# Patient Record
Sex: Female | Born: 1980 | Race: White | Hispanic: No | Marital: Married | Smoking: Former smoker
Health system: Southern US, Community
[De-identification: ages and names within clinical notes are randomized; demographics above are authoritative.]

## PROBLEM LIST (undated history)

## (undated) ENCOUNTER — Inpatient Hospital Stay (HOSPITAL_COMMUNITY): Payer: Self-pay

## (undated) DIAGNOSIS — R51 Headache: Secondary | ICD-10-CM

## (undated) DIAGNOSIS — K219 Gastro-esophageal reflux disease without esophagitis: Secondary | ICD-10-CM

## (undated) DIAGNOSIS — K589 Irritable bowel syndrome without diarrhea: Secondary | ICD-10-CM

## (undated) DIAGNOSIS — E282 Polycystic ovarian syndrome: Secondary | ICD-10-CM

## (undated) DIAGNOSIS — Z8619 Personal history of other infectious and parasitic diseases: Secondary | ICD-10-CM

## (undated) DIAGNOSIS — Z8489 Family history of other specified conditions: Secondary | ICD-10-CM

## (undated) DIAGNOSIS — F32A Depression, unspecified: Secondary | ICD-10-CM

## (undated) DIAGNOSIS — O24419 Gestational diabetes mellitus in pregnancy, unspecified control: Secondary | ICD-10-CM

## (undated) DIAGNOSIS — Z87442 Personal history of urinary calculi: Secondary | ICD-10-CM

## (undated) DIAGNOSIS — O139 Gestational [pregnancy-induced] hypertension without significant proteinuria, unspecified trimester: Secondary | ICD-10-CM

## (undated) DIAGNOSIS — J45909 Unspecified asthma, uncomplicated: Secondary | ICD-10-CM

## (undated) DIAGNOSIS — F419 Anxiety disorder, unspecified: Secondary | ICD-10-CM

## (undated) DIAGNOSIS — F329 Major depressive disorder, single episode, unspecified: Secondary | ICD-10-CM

## (undated) DIAGNOSIS — G4730 Sleep apnea, unspecified: Secondary | ICD-10-CM

## (undated) HISTORY — DX: Personal history of other infectious and parasitic diseases: Z86.19

## (undated) HISTORY — DX: Depression, unspecified: F32.A

## (undated) HISTORY — PX: WISDOM TOOTH EXTRACTION: SHX21

## (undated) HISTORY — DX: Major depressive disorder, single episode, unspecified: F32.9

## (undated) HISTORY — DX: Irritable bowel syndrome without diarrhea: K58.9

## (undated) HISTORY — PX: LAPAROSCOPIC OVARIAN CYSTECTOMY: SUR786

---
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
## 2012-03-12 LAB — OB RESULTS CONSOLE GC/CHLAMYDIA
Chlamydia: NEGATIVE
Gonorrhea: NEGATIVE

## 2012-03-12 LAB — OB RESULTS CONSOLE HIV ANTIBODY (ROUTINE TESTING): HIV: NONREACTIVE

## 2012-03-12 LAB — OB RESULTS CONSOLE RUBELLA ANTIBODY, IGM: Rubella: IMMUNE

## 2012-05-03 ENCOUNTER — Inpatient Hospital Stay (HOSPITAL_COMMUNITY)
Admission: AD | Admit: 2012-05-03 | Discharge: 2012-05-03 | Disposition: A | Discharge status: Home / Self Care | Payer: BC Managed Care – PPO | Source: Ambulatory Visit | Attending: Obstetrics and Gynecology | Admitting: Obstetrics and Gynecology

## 2012-05-03 ENCOUNTER — Encounter (HOSPITAL_COMMUNITY): Payer: Self-pay | Admitting: *Deleted

## 2012-05-03 DIAGNOSIS — R51 Headache: Secondary | ICD-10-CM | POA: Insufficient documentation

## 2012-05-03 DIAGNOSIS — O99891 Other specified diseases and conditions complicating pregnancy: Secondary | ICD-10-CM | POA: Insufficient documentation

## 2012-05-03 DIAGNOSIS — O26899 Other specified pregnancy related conditions, unspecified trimester: Secondary | ICD-10-CM

## 2012-05-03 HISTORY — DX: Anxiety disorder, unspecified: F41.9

## 2012-05-03 HISTORY — DX: Gestational (pregnancy-induced) hypertension without significant proteinuria, unspecified trimester: O13.9

## 2012-05-03 HISTORY — DX: Headache: R51

## 2012-05-03 HISTORY — DX: Gestational diabetes mellitus in pregnancy, unspecified control: O24.419

## 2012-05-03 LAB — URINALYSIS, ROUTINE W REFLEX MICROSCOPIC
Glucose, UA: NEGATIVE mg/dL
Hgb urine dipstick: NEGATIVE
Specific Gravity, Urine: 1.02 (ref 1.005–1.030)

## 2012-05-03 LAB — URINE MICROSCOPIC-ADD ON

## 2012-05-03 MED ORDER — OXYCODONE-ACETAMINOPHEN 5-325 MG PO TABS
1.0000 | ORAL_TABLET | Freq: Once | ORAL | Status: AC
  Administered 2012-05-03: 1 via ORAL
  Filled 2012-05-03: qty 1

## 2012-05-03 NOTE — Discharge Instructions (Signed)
If you have a neurologist who follows you for your migraines you should schedule a follow up appointment. If you do not have one call the office and they will arrange an appointment for you.  General Headache, Without Cause A general headache has no specific cause. These headaches are not life-threatening. They will not lead to other types of headaches. HOME CARE   Make and keep follow-up visits with your doctor.   Only take medicine as told by your doctor.   Try to relax, get a massage, or use your thoughts to control your body (biofeedback).   Apply cold or heat to the head and neck. Apply 3 or 4 times a day or as needed.  Finding out the results of your test Ask when your test results will be ready. Make sure you get your test results. GET HELP RIGHT AWAY IF:   You have problems with medicine.   Your medicine does not help relieve pain.   Your headache changes or becomes worse.   You feel sick to your stomach (nauseous) or throw up (vomit).   You have a temperature by mouth above 102 F (38.9 C), not controlled by medicine.   Your have a stiff neck.   You have vision loss.   You have muscle weakness.   You lose control of your muscles.   You lose balance or have trouble walking.   You feel like you are going to pass out (faint).  MAKE SURE YOU:   Understand these instructions.   Will watch this condition.   Will get help right away if you are not doing well or get worse.  Document Released: 08/15/2008 Document Revised: 10/26/2011 Document Reviewed: 08/15/2008 [facility] Patient Information 2012 [place], [place].

## 2012-05-03 NOTE — MAU Provider Note (Signed)
History     CSN: [id number]  Arrival date & time 05/03/12  1053   None     HPI Natalie Andrews is a 31 y.o. female @ [redacted]w[redacted]d gestation who presents to MAU for headache. History of migraines and this headache feels similar. The headache started 24 hours ago. The headache is located bilateral temporal area. She rates the pain as 7/10. She describes the pain as feeling like a squeezing pain on both sides of her head. Denies nausea or vomiting or visual disturbances. The history was provided by the patient.   Past Medical History  Diagnosis Date  . Anxiety   . Pregnancy induced hypertension   . Diabetes mellitus   . Headache     migraines  . Gestational diabetes     Past Surgical History  Procedure Date  . Pilonidal cyst excision 2002  . Tonsillectomy 1989  . Fracture surgery 1999    ankle    Family History  Problem Relation Age of Onset  . Arthritis Mother   . Depression Mother   . Miscarriages / [place] Mother   . Diabetes Father   . Early death Father   . Kidney disease Father   . Vision loss Father   . Arthritis Son     History  Substance Use Topics  . Smoking status: Former Smoker    Quit date: 05/04/2007  . Smokeless tobacco: Not on file  . Alcohol Use: No    OB History    Grav Para Term Preterm Abortions TAB SAB Ect Mult Living   4 1 1  0 2 0 2 0 0 1      Review of Systems  Constitutional: Negative for fever, chills, diaphoresis and fatigue.  HENT: Negative for ear pain, congestion, sore throat, facial swelling, neck pain, neck stiffness, dental problem and sinus pressure.   Eyes: Negative for photophobia, pain, discharge and visual disturbance.  Respiratory: Negative for cough, chest tightness and wheezing.   Cardiovascular: Negative for chest pain and palpitations.  Gastrointestinal: Negative for nausea, vomiting, abdominal pain, diarrhea, constipation and abdominal distention.  Genitourinary: Positive for frequency. Negative for dysuria, flank pain,  vaginal bleeding, vaginal discharge, difficulty urinating and pelvic pain.  Musculoskeletal: Negative for myalgias, back pain and gait problem.  Skin: Negative for color change and rash.  Neurological: Positive for headaches. Negative for dizziness, speech difficulty, weakness, light-headedness and numbness.  Psychiatric/Behavioral: Negative for confusion and agitation. The patient is not nervous/anxious.     Allergies  Prednisone  Home Medications  No current outpatient prescriptions on file.  BP 133/72  Pulse 87  Temp 97 F (36.1 C) (Oral)  Resp 20  Ht 5\' 4"  (1.626 m)  Wt 304 lb (137.893 kg)  BMI 52.18 kg/m2  LMP 01/15/2012  Physical Exam  Nursing note and vitals reviewed. Constitutional: She is oriented to person, place, and time. She appears well-developed and well-nourished.  HENT:  Head: Normocephalic.  Eyes: EOM are normal.  Neck: Neck supple.  Cardiovascular: Normal rate.   Pulmonary/Chest: Effort normal.  Abdominal: Soft. There is no tenderness.       Positive FHT.  Musculoskeletal: Normal range of motion. She exhibits no edema.       Normal pulses bilateral, upper and lower extremities  Neurological: She is alert and oriented to person, place, and time. She has normal strength and normal reflexes. No cranial nerve deficit or sensory deficit. She displays a negative Romberg sign. Coordination normal.  Skin: Skin is warm and dry.  Psychiatric:  She has a normal mood and affect. Her behavior is normal. Judgment and thought content normal.   Results for orders placed during the hospital encounter of 05/03/12 (from the past 24 hour(s))  URINALYSIS, ROUTINE W REFLEX MICROSCOPIC     Status: Abnormal   Collection Time   05/03/12 12:38 PM      Component Value Range   Color, Urine YELLOW  YELLOW   APPearance CLOUDY (*) CLEAR   Specific Gravity, Urine 1.020  1.005 - 1.030   pH 7.0  5.0 - 8.0   Glucose, UA NEGATIVE  NEGATIVE mg/dL   Hgb urine dipstick NEGATIVE  NEGATIVE     Bilirubin Urine NEGATIVE  NEGATIVE   Ketones, ur NEGATIVE  NEGATIVE mg/dL   Protein, ur NEGATIVE  NEGATIVE mg/dL   Urobilinogen, UA 0.2  0.0 - 1.0 mg/dL   Nitrite NEGATIVE  NEGATIVE   Leukocytes, UA MODERATE (*) NEGATIVE  URINE MICROSCOPIC-ADD ON     Status: Abnormal   Collection Time   05/03/12 12:38 PM      Component Value Range   Squamous Epithelial / LPF MANY (*) RARE   WBC, UA 3-6  <3 WBC/hpf   RBC / HPF 0-2  <3 RBC/hpf   Bacteria, UA MANY (*) RARE    ED Course  Procedures Re evaluation: After percocet the patient states her headache is much better. I discussed her findings with Dr. Marcelle Overlie and the patient is to follow up with her neurologist for her headaches. She will return to the office as scheduled.  MDM  Assessment: Headache in pregnancy  Plan:  Patient has Vicodin at home    Follow up with neurologist   Return as needed.

## 2012-05-04 LAB — URINE CULTURE

## 2012-05-18 ENCOUNTER — Encounter (HOSPITAL_COMMUNITY): Payer: Self-pay | Admitting: *Deleted

## 2012-05-18 ENCOUNTER — Inpatient Hospital Stay (HOSPITAL_COMMUNITY): Payer: BC Managed Care – PPO

## 2012-05-18 ENCOUNTER — Inpatient Hospital Stay (HOSPITAL_COMMUNITY)
Admission: AD | Admit: 2012-05-18 | Discharge: 2012-05-18 | Disposition: A | Discharge status: Home / Self Care | Payer: BC Managed Care – PPO | Source: Ambulatory Visit | Attending: Obstetrics and Gynecology | Admitting: Obstetrics and Gynecology

## 2012-05-18 DIAGNOSIS — O99891 Other specified diseases and conditions complicating pregnancy: Secondary | ICD-10-CM | POA: Insufficient documentation

## 2012-05-18 DIAGNOSIS — R109 Unspecified abdominal pain: Secondary | ICD-10-CM | POA: Insufficient documentation

## 2012-05-18 DIAGNOSIS — R823 Hemoglobinuria: Secondary | ICD-10-CM

## 2012-05-18 DIAGNOSIS — Z331 Pregnant state, incidental: Secondary | ICD-10-CM

## 2012-05-18 DIAGNOSIS — O210 Mild hyperemesis gravidarum: Secondary | ICD-10-CM | POA: Insufficient documentation

## 2012-05-18 LAB — URINALYSIS, ROUTINE W REFLEX MICROSCOPIC
Glucose, UA: NEGATIVE mg/dL
Protein, ur: NEGATIVE mg/dL
Specific Gravity, Urine: 1.025 (ref 1.005–1.030)
Urobilinogen, UA: 0.2 mg/dL (ref 0.0–1.0)

## 2012-05-18 LAB — URINE MICROSCOPIC-ADD ON

## 2012-05-18 MED ORDER — HYDROCODONE-ACETAMINOPHEN 5-325 MG PO TABS
1.0000 | ORAL_TABLET | Freq: Once | ORAL | Status: AC
  Administered 2012-05-18: 1 via ORAL
  Filled 2012-05-18: qty 1

## 2012-05-18 NOTE — Discharge Instructions (Signed)
Drink at least 8 8-oz glasses of water every day.  Take Tylenol 325 mg 2 tablets by mouth every 4 hours if needed for pain.  Call your doctor if your symptoms worsen.

## 2012-05-18 NOTE — MAU Provider Note (Signed)
History     CSN: [id number]  Arrival date and time: 05/18/12 1419   First Provider Initiated Contact with Patient 05/18/12 1527      Chief Complaint  Patient presents with  . Flank Pain  . Abdominal Pain  . Nausea   HPI Natalie Andrews 31 y.o. [redacted]w[redacted]d Comes to MAU with sudden acute onset of left flank pain which started within one hour of arriving in MAU.  Pain begins in left flank and radiates to left abdomen.  Having severe nausea and vomited on arriving in MAU.  Pain was less after episode of vomiting.  Has not taken any medication for the pain.  OB History    Grav Para Term Preterm Abortions TAB SAB Ect Mult Living   4 1 1  0 2 0 2 0 0 1      Past Medical History  Diagnosis Date  . Anxiety   . Pregnancy induced hypertension   . Diabetes mellitus   . Headache     migraines  . Gestational diabetes     Past Surgical History  Procedure Date  . Pilonidal cyst excision 2002  . Tonsillectomy 1989  . Fracture surgery 1999    ankle    Family History  Problem Relation Age of Onset  . Arthritis Mother   . Depression Mother   . Miscarriages / [place] Mother   . Diabetes Father   . Early death Father   . Kidney disease Father   . Vision loss Father   . Arthritis Son     History  Substance Use Topics  . Smoking status: Former Smoker    Quit date: 05/04/2007  . Smokeless tobacco: Not on file  . Alcohol Use: No    Allergies:  Allergies  Allergen Reactions  . Prednisone Hives    Prescriptions prior to admission  Medication Sig Dispense Refill  . Hydrocodone-Acetaminophen (VICODIN) 5-300 MG TABS Take 1 tablet by mouth daily as needed. For migraines.      . Prenatal Vit-Fe Fumarate-FA (PRENATAL MULTIVITAMIN) TABS Take 1 tablet by mouth at bedtime.       Marland Kitchen venlafaxine XR (EFFEXOR-XR) 150 MG 24 hr capsule Take 150 mg by mouth at bedtime.         Review of Systems  Gastrointestinal: Positive for nausea and vomiting.       Left back pain radiating to left  abdomen  Genitourinary: Positive for flank pain. Negative for dysuria, urgency and hematuria.   Physical Exam   Blood pressure 130/70, pulse 78, temperature 97.8 F (36.6 C), temperature source Oral, resp. rate 22, height 5\' 4"  (1.626 m), weight 313 lb 6.4 oz (142.157 kg), last menstrual period 01/15/2012, SpO2 98.00%.  Physical Exam  Nursing note and vitals reviewed. Constitutional: She is oriented to person, place, and time. She appears well-developed and well-nourished.       obese  HENT:  Head: Normocephalic.  Eyes: EOM are normal.  Neck: Neck supple.  GI: Soft. There is no tenderness.       FHT heard with doppler. - 160.  Genitourinary:       Soreness in left flank, but no CVA tenderness  Musculoskeletal: Normal range of motion.  Neurological: She is alert and oriented to person, place, and time.  Skin: Skin is warm and dry.  Psychiatric: She has a normal mood and affect.    MAU Course  Procedures *RADIOLOGY REPORT*  Clinical Data: Acute onset of left-sided flank pain.  Hemoglobinuria the.  RENAL/URINARY TRACT ULTRASOUND  COMPLETE  Comparison: No priors.  Findings:  Right Kidney: No hydronephrosis. Well-preserved cortex. Normal  size and parenchymal echogenicity, with the exception of a small  echogenic focus in the upper pole collecting system of the right  kidney, without definite distal acoustic shadowing. 13.4 cm in  length.  Left Kidney: No hydronephrosis. Well-preserved cortex. Normal  size and parenchymal echotexture without focal abnormalities. 13.3  cm in length  Bladder: Urinary bladder is well distended without focal wall  abnormalities. Bilateral ureteral jets are noted.  IMPRESSION:  1. Small echogenic focus in the upper pole collecting system of  the right kidney. This is of uncertain etiology and significance.  This could represent a nonshadowing stone, but could alternatively  be a normal part of the collecting system. If there is strong  clinical  concern for nephrolithiasis, this could be better  evaluated with a noncontrast CT scan of the abdomen.  2. Normal appearance of the left kidney and urinary bladder.   MDM [id number]  Consult with Dr. Arelia Sneddon re: plan of care 1712  Client feeling better, but still has dull pain in left flank.  Requesting pain medication.  Will give one vicodin tablet and discharge.  Discussed results of renal ultrasound.  Urine culture pending.  Assessment and Plan  Left flank pain Pregnant [redacted] weeks Hemoglobinuria   Plan Drink at least 8 8-oz glasses of water every day. Take Tylenol 325 mg 2 tablets by mouth every 4 hours if needed for pain. Call your doctor if your symptoms worsen.  [name] 05/18/2012, 3:38 PM

## 2012-05-18 NOTE — MAU Note (Signed)
Patient states she had sudden onset of left flank pain radiating to the left lower abdomen and nausea about 30 minutes ago. Denies any bleeding.

## 2012-05-19 ENCOUNTER — Encounter (HOSPITAL_COMMUNITY): Payer: Self-pay | Admitting: *Deleted

## 2012-05-19 ENCOUNTER — Inpatient Hospital Stay (HOSPITAL_COMMUNITY)
Admission: AD | Admit: 2012-05-19 | Discharge: 2012-05-19 | Disposition: A | Discharge status: Home / Self Care | Payer: BC Managed Care – PPO | Source: Ambulatory Visit | Attending: Obstetrics and Gynecology | Admitting: Obstetrics and Gynecology

## 2012-05-19 DIAGNOSIS — N289 Disorder of kidney and ureter, unspecified: Secondary | ICD-10-CM

## 2012-05-19 DIAGNOSIS — O99891 Other specified diseases and conditions complicating pregnancy: Secondary | ICD-10-CM | POA: Insufficient documentation

## 2012-05-19 DIAGNOSIS — O210 Mild hyperemesis gravidarum: Secondary | ICD-10-CM | POA: Insufficient documentation

## 2012-05-19 DIAGNOSIS — N200 Calculus of kidney: Secondary | ICD-10-CM

## 2012-05-19 DIAGNOSIS — R109 Unspecified abdominal pain: Secondary | ICD-10-CM | POA: Insufficient documentation

## 2012-05-19 DIAGNOSIS — M549 Dorsalgia, unspecified: Secondary | ICD-10-CM | POA: Insufficient documentation

## 2012-05-19 DIAGNOSIS — O26839 Pregnancy related renal disease, unspecified trimester: Secondary | ICD-10-CM

## 2012-05-19 LAB — URINALYSIS, ROUTINE W REFLEX MICROSCOPIC
Bilirubin Urine: NEGATIVE
Glucose, UA: NEGATIVE mg/dL
Ketones, ur: NEGATIVE mg/dL
Nitrite: NEGATIVE
Protein, ur: NEGATIVE mg/dL
Specific Gravity, Urine: 1.015 (ref 1.005–1.030)
Urobilinogen, UA: 0.2 mg/dL (ref 0.0–1.0)
pH: 7 (ref 5.0–8.0)

## 2012-05-19 LAB — URINE CULTURE: Colony Count: 15000

## 2012-05-19 MED ORDER — LACTATED RINGERS IV SOLN
INTRAVENOUS | Status: DC
  Administered 2012-05-19 (×2): via INTRAVENOUS

## 2012-05-19 MED ORDER — HYDROMORPHONE HCL PF 1 MG/ML IJ SOLN
2.0000 mg | Freq: Once | INTRAMUSCULAR | Status: AC
  Administered 2012-05-19: 1 mg via INTRAVENOUS
  Filled 2012-05-19: qty 1

## 2012-05-19 MED ORDER — HYDROMORPHONE HCL 2 MG PO TABS: 2.0000 mg | ORAL_TABLET | Freq: Four times a day (QID) | ORAL | Status: DC | PRN

## 2012-05-19 NOTE — MAU Note (Signed)
C/o burning with urination; stated that there was blood in the urine;

## 2012-05-19 NOTE — MAU Note (Signed)
Pt reports she was here yesterday with back pain thought it may be a kidney stone. Pain is worse today and she reports having a heavy mucusy discharge that  Larey Seat out this morning

## 2012-05-19 NOTE — MAU Provider Note (Signed)
  History     CSN: [id number]  Arrival date and time: 05/19/12 1130   First Provider Initiated Contact with Patient 05/19/12 1222      Chief Complaint  Patient presents with  . Back Pain   HPI Natalie Andrews is a 31 y.o. [id number] at [redacted]w[redacted]d . She returns with increased  L flank pain. Had MAU visit yesterday for back pain, N&V. U/S showed ? None shadowing stone on R, otherwise nl renal scan. She had 21-50 RBC's in urine with CA oxalate crystals. She started having pain at 10 am, took 1 vicodin, not helping at all. Has gross hematuria now, no N&V but yelling out with discomfort every 2-69minutes.  She alos noticed large mucodi vaginal discharge this am, yellow /green in color. No vaginal bleeding or cramping.    Past Medical History  Diagnosis Date  . Anxiety   . Pregnancy induced hypertension   . Diabetes mellitus   . Headache     migraines  . Gestational diabetes     Past Surgical History  Procedure Date  . Pilonidal cyst excision 2002  . Tonsillectomy 1989  . Fracture surgery 1999    ankle    Family History  Problem Relation Age of Onset  . Arthritis Mother   . Depression Mother   . Miscarriages / [place] Mother   . Diabetes Father   . Early death Father   . Kidney disease Father   . Vision loss Father   . Arthritis Son     History  Substance Use Topics  . Smoking status: Former Smoker    Quit date: 05/04/2007  . Smokeless tobacco: Not on file  . Alcohol Use: No    Allergies:  Allergies  Allergen Reactions  . Prednisone Hives    Prescriptions prior to admission  Medication Sig Dispense Refill  . Hydrocodone-Acetaminophen (VICODIN) 5-300 MG TABS Take 1 tablet by mouth daily as needed. For migraines.      . Prenatal Vit-Fe Fumarate-FA (PRENATAL MULTIVITAMIN) TABS Take 1 tablet by mouth at bedtime.       Marland Kitchen venlafaxine XR (EFFEXOR-XR) 150 MG 24 hr capsule Take 150 mg by mouth at bedtime.         ROS Physical Exam   Blood pressure 134/72, pulse 88,  temperature 98.4 F (36.9 C), temperature source Oral, resp. rate 18, height 5\' 4"  (1.626 m), weight 311 lb 6.4 oz (141.25 kg), last menstrual period 01/15/2012.  Physical Exam  MAU Course  Procedures  MDM Consulted with Dr Arelia Sneddon at 12:43 15:45 Pt feeling better after 2 liters of fluids and dilaudid 2 mg IV. Has strained urine, so stones. Talked with Dr Erlinda Hong Comb, may e discharged, increase fluids, Rx dilaudid 2 mg #30 1 Q 6 hr prn pain, f/u in office this week.  Assessment and Plan    Natalie Andrews M. 05/19/2012, 12:34 PM

## 2012-05-19 NOTE — Discharge Instructions (Signed)
Oxalate Restricted Diet A low-oxalate diet consists of foods that are low in a naturally occurring compound called oxalate that is found in plants.  INDICATIONS FOR USE Your caregiver may ask you to follow a low-oxalate diet in order to reduce certain types of kidney stones.  GUIDELINES  Avoid high-oxalate foods listed below:  Grains: High-fiber or bran cereal, whole-wheat bread, grits, barley, buckwheat, graham crackers, amaranth, pretzels, and fruitcake.   Vegetables: Dried beans, wax beans, French fries, sweet potatoes, chives, dark leafy greens, eggplant, leaks, okra, parsley, rutabaga, tomato paste, watercress, and escarole.   Fruit: Dried apricots, red currants, figs, kiwi, plums, and rhubarb.   Meat and Meat Substitutes: All nuts and nut butters, sesame seeds, and tahini paste. Soybeans and foods made from soy (soyburger, miso).   Milk: Chocolate milk and soymilk.   Fats and Oils: None to avoid.   Condiments/Miscellaneous: Chocolate, carob, marmalade, poppy seeds, instant iced tea, and juice from high-oxalate fruits.  Document Released: 03/03/2011 Document Revised: 10/26/2011 Document Reviewed: 03/03/2011 [employer] Patient Information 2012 [employer].Kidney Stones Kidney stones (ureteral lithiasis) are deposits that form inside your kidneys. The intense pain is caused by the stone moving through the urinary tract. When the stone moves, the ureter goes into spasm around the stone. The stone is usually passed in the urine.  CAUSES  A disorder that makes certain neck glands produce too much parathyroid hormone (primary hyperparathyroidism).  A buildup of uric acid crystals.  Narrowing (stricture) of the ureter.  A kidney obstruction present at birth (congenital obstruction).  Previous surgery on the kidney or ureters.  Numerous kidney infections.  SYMPTOMS  Feeling sick to your stomach (nauseous).  Throwing up (vomiting).  Blood in the urine (hematuria).  Pain that usually  spreads (radiates) to the groin.  Frequency or urgency of urination.  DIAGNOSIS  Taking a history and physical exam.  Blood or urine tests.  Computerized X-ray scan (CT scan).  Occasionally, an examination of the inside of the urinary bladder (cystoscopy) is performed.  TREATMENT  Observation.  Increasing your fluid intake.  Surgery may be needed if you have severe pain or persistent obstruction.  The size, location, and chemical composition are all important variables that will determine the proper choice of action for you. Talk to your caregiver to better understand your situation so that you will minimize the risk of injury to yourself and your kidney.  HOME CARE INSTRUCTIONS  Drink enough water and fluids to keep your urine clear or pale yellow.  Strain all urine through the provided strainer. Keep all particulate matter and stones for your caregiver to see. The stone causing the pain may be as small as a grain of salt. It is very important to use the strainer each and every time you pass your urine. The collection of your stone will allow your caregiver to analyze it and verify that a stone has actually passed.  Only take over-the-counter or prescription medicines for pain, discomfort, or fever as directed by your caregiver.  Make a follow-up appointment with your caregiver as directed.  Get follow-up X-rays if required. The absence of pain does not always mean that the stone has passed. It may have only stopped moving. If the urine remains completely obstructed, it can cause loss of kidney function or even complete destruction of the kidney. It is your responsibility to make sure X-rays and follow-ups are completed. Ultrasounds of the kidney can show blockages and the status of the kidney. Ultrasounds are not associated with   any radiation and can be performed easily in a matter of minutes.  SEEK IMMEDIATE MEDICAL CARE IF:  Pain cannot be controlled with the prescribed medicine.  You have a  fever.  The severity or intensity of pain increases over 18 hours and is not relieved by pain medicine.  You develop a new onset of abdominal pain.  You feel faint or pass out.  MAKE SURE YOU:  Understand these instructions.  Will watch your condition.  Will get help right away if you are not doing well or get worse.  Document Released: 11/06/2005 Document Revised: 10/26/2011 Document Reviewed: 03/04/2010 [employer] Patient Information 2012 [employer]. 

## 2012-05-21 ENCOUNTER — Inpatient Hospital Stay (HOSPITAL_COMMUNITY)
Admission: AD | Admit: 2012-05-21 | Discharge: 2012-05-23 | DRG: 886 | Disposition: A | Discharge status: Home / Self Care | Payer: BC Managed Care – PPO | Source: Ambulatory Visit | Attending: Obstetrics and Gynecology | Admitting: Obstetrics and Gynecology

## 2012-05-21 ENCOUNTER — Inpatient Hospital Stay (HOSPITAL_COMMUNITY): Payer: BC Managed Care – PPO

## 2012-05-21 ENCOUNTER — Encounter (HOSPITAL_COMMUNITY): Payer: Self-pay | Admitting: *Deleted

## 2012-05-21 DIAGNOSIS — Z3490 Encounter for supervision of normal pregnancy, unspecified, unspecified trimester: Secondary | ICD-10-CM

## 2012-05-21 DIAGNOSIS — N23 Unspecified renal colic: Secondary | ICD-10-CM | POA: Diagnosis present

## 2012-05-21 DIAGNOSIS — N200 Calculus of kidney: Secondary | ICD-10-CM

## 2012-05-21 DIAGNOSIS — R109 Unspecified abdominal pain: Secondary | ICD-10-CM | POA: Diagnosis present

## 2012-05-21 DIAGNOSIS — O99891 Other specified diseases and conditions complicating pregnancy: Principal | ICD-10-CM

## 2012-05-21 LAB — URINE MICROSCOPIC-ADD ON

## 2012-05-21 LAB — URINALYSIS, ROUTINE W REFLEX MICROSCOPIC
Glucose, UA: NEGATIVE mg/dL
Leukocytes, UA: NEGATIVE
Protein, ur: NEGATIVE mg/dL
Specific Gravity, Urine: 1.025 (ref 1.005–1.030)
pH: 6 (ref 5.0–8.0)

## 2012-05-21 MED ORDER — ZOLPIDEM TARTRATE 5 MG PO TABS: 5.0000 mg | ORAL_TABLET | Freq: Every evening | ORAL | Status: DC | PRN

## 2012-05-21 MED ORDER — NALOXONE HCL 0.4 MG/ML IJ SOLN: 0.4000 mg | INTRAMUSCULAR | Status: DC | PRN

## 2012-05-21 MED ORDER — LACTATED RINGERS IV SOLN
INTRAVENOUS | Status: DC
  Administered 2012-05-21 – 2012-05-23 (×7): via INTRAVENOUS

## 2012-05-21 MED ORDER — DOCUSATE SODIUM 100 MG PO CAPS
100.0000 mg | ORAL_CAPSULE | Freq: Every day | ORAL | Status: DC
  Administered 2012-05-21 – 2012-05-23 (×3): 100 mg via ORAL
  Filled 2012-05-21 (×3): qty 1

## 2012-05-21 MED ORDER — HYDROMORPHONE 0.3 MG/ML IV SOLN
INTRAVENOUS | Status: DC
  Administered 2012-05-21: 08:00:00 via INTRAVENOUS
  Filled 2012-05-21: qty 25

## 2012-05-21 MED ORDER — PRENATAL MULTIVITAMIN CH
1.0000 | ORAL_TABLET | Freq: Every day | ORAL | Status: DC
  Administered 2012-05-21 – 2012-05-23 (×3): 1 via ORAL
  Filled 2012-05-21 (×3): qty 1

## 2012-05-21 MED ORDER — HYDROMORPHONE HCL 2 MG PO TABS
2.0000 mg | ORAL_TABLET | Freq: Once | ORAL | Status: AC
  Administered 2012-05-21: 2 mg via ORAL
  Filled 2012-05-21: qty 1

## 2012-05-21 MED ORDER — ACETAMINOPHEN 325 MG PO TABS: 650.0000 mg | ORAL_TABLET | ORAL | Status: DC | PRN

## 2012-05-21 MED ORDER — DIPHENHYDRAMINE HCL 12.5 MG/5ML PO ELIX: 12.5000 mg | ORAL_SOLUTION | Freq: Four times a day (QID) | ORAL | Status: DC | PRN

## 2012-05-21 MED ORDER — SODIUM CHLORIDE 0.9 % IJ SOLN: 9.0000 mL | INTRAMUSCULAR | Status: DC | PRN

## 2012-05-21 MED ORDER — DIPHENHYDRAMINE HCL 50 MG/ML IJ SOLN: 12.5000 mg | Freq: Four times a day (QID) | INTRAMUSCULAR | Status: DC | PRN

## 2012-05-21 MED ORDER — DIPHENHYDRAMINE HCL 12.5 MG/5ML PO ELIX
12.5000 mg | ORAL_SOLUTION | Freq: Four times a day (QID) | ORAL | Status: DC | PRN
  Filled 2012-05-21: qty 5

## 2012-05-21 MED ORDER — ONDANSETRON HCL 4 MG/2ML IJ SOLN: 4.0000 mg | Freq: Four times a day (QID) | INTRAMUSCULAR | Status: DC | PRN

## 2012-05-21 MED ORDER — ACETAMINOPHEN 325 MG PO TABS
650.0000 mg | ORAL_TABLET | Freq: Once | ORAL | Status: AC
  Administered 2012-05-21: 650 mg via ORAL
  Filled 2012-05-21: qty 2

## 2012-05-21 MED ORDER — ONDANSETRON HCL 4 MG/2ML IJ SOLN
4.0000 mg | Freq: Four times a day (QID) | INTRAMUSCULAR | Status: DC | PRN
  Administered 2012-05-21: 4 mg via INTRAVENOUS
  Filled 2012-05-21: qty 2

## 2012-05-21 MED ORDER — CALCIUM CARBONATE ANTACID 500 MG PO CHEW: 2.0000 | CHEWABLE_TABLET | ORAL | Status: DC | PRN

## 2012-05-21 MED ORDER — VENLAFAXINE HCL ER 150 MG PO CP24
150.0000 mg | ORAL_CAPSULE | Freq: Every day | ORAL | Status: DC
  Administered 2012-05-21 – 2012-05-22 (×2): 150 mg via ORAL
  Filled 2012-05-21 (×2): qty 1

## 2012-05-21 MED ORDER — HYDROMORPHONE 0.3 MG/ML IV SOLN
INTRAVENOUS | Status: DC
  Administered 2012-05-21: 1.19 mg via INTRAVENOUS
  Administered 2012-05-21: 2.68 mg via INTRAVENOUS
  Administered 2012-05-21: 1.2 mg via INTRAVENOUS
  Administered 2012-05-21: 4.8 mg via INTRAVENOUS
  Administered 2012-05-22: 0.599 mg via INTRAVENOUS
  Administered 2012-05-22: 1.19 mg via INTRAVENOUS
  Filled 2012-05-21: qty 25

## 2012-05-21 MED ORDER — CYCLOBENZAPRINE HCL 10 MG PO TABS
10.0000 mg | ORAL_TABLET | Freq: Once | ORAL | Status: AC
  Administered 2012-05-21: 10 mg via ORAL
  Filled 2012-05-21: qty 1

## 2012-05-21 NOTE — Progress Notes (Signed)
UR Chart review completed.  

## 2012-05-21 NOTE — MAU Note (Signed)
Pt report L sided flank pain unrelieved by dilauid for the past 8 hours

## 2012-05-21 NOTE — MAU Provider Note (Signed)
History     CSN: [id number]  Arrival date and time: 05/21/12 0210   First Provider Initiated Contact with Patient 05/21/12 0236      Chief Complaint  Patient presents with  . Flank Pain   HPI This is a 31 y.o. female at [redacted]w[redacted]d who presents with c/o Continued Left flank pain for several days. Was seen her twice previously for this and it has persisted. Denies hematuria. Denies contractions. Pain is mid back and wraps around front. Also has intermittent N/V.  OB History    Grav Para Term Preterm Abortions TAB SAB Ect Mult Living   4 1 1  0 2 0 2 0 0 1      Past Medical History  Diagnosis Date  . Anxiety   . Pregnancy induced hypertension   . Diabetes mellitus   . Headache     migraines  . Gestational diabetes     Past Surgical History  Procedure Date  . Pilonidal cyst excision 2002  . Tonsillectomy 1989  . Fracture surgery 1999    ankle    Family History  Problem Relation Age of Onset  . Arthritis Mother   . Depression Mother   . Miscarriages / [place] Mother   . Diabetes Father   . Early death Father   . Kidney disease Father   . Vision loss Father   . Arthritis Son   . Other Neg Hx     History  Substance Use Topics  . Smoking status: Former Smoker    Quit date: 05/04/2007  . Smokeless tobacco: Not on file  . Alcohol Use: No    Allergies:  Allergies  Allergen Reactions  . Prednisone Hives    Prescriptions prior to admission  Medication Sig Dispense Refill  . Prenatal Vit-Fe Fumarate-FA (PRENATAL MULTIVITAMIN) TABS Take 1 tablet by mouth at bedtime.       Marland Kitchen venlafaxine XR (EFFEXOR-XR) 150 MG 24 hr capsule Take 150 mg by mouth at bedtime.       Marland Kitchen HYDROmorphone (DILAUDID) 2 MG tablet Take 1 tablet (2 mg total) by mouth every 6 (six) hours as needed for pain.  30 tablet  0    ROS As listed above.  Physical Exam   Blood pressure 143/74, temperature 97.4 F (36.3 C), temperature source Oral, resp. rate 26, last menstrual period 01/15/2012,  SpO2 99.00%.  Physical Exam  Constitutional: She is oriented to person, place, and time. She appears well-developed and well-nourished. She appears distressed (rocks back and forth at times).  HENT:  Head: Normocephalic.  Cardiovascular: Normal rate.   Respiratory: Effort normal.  GI: Soft. She exhibits no distension and no mass. There is tenderness (over left paraspinous muscles at T12). There is no rebound and no guarding.  Genitourinary: No vaginal discharge found.  Musculoskeletal: Normal range of motion.  Neurological: She is alert and oriented to person, place, and time.  Skin: Skin is warm and dry.  Psychiatric: She has a normal mood and affect.    MAU Course  Procedures  MDM Tried dose of Flexeril and Tylenol with some relief but "not much".   [place] ordered to reevaluate left kidney/ureter. US Renal  05/21/2012  *RADIOLOGY REPORT*  Clinical Data: Recurrent left flank pain.  RENAL/URINARY TRACT ULTRASOUND COMPLETE  Comparison:  05/18/2012  Findings:  Right Kidney:  Measures 12.6 cm.  No hydronephrosis or focal abnormality.  Left Kidney:  Measures 11.9 cm. Mild pelvicaliectasis.  No focal abnormality.  Bladder:  Normal right ureteral jet.  Left ureteral jet poorly visualized. No shadowing calculi however there is asymmetric fullness in the region of the left UVJ which raises possibility of a tiny left UVJ stone. Bladder otherwise unremarkable.  No ureteral dilatation visualized.  Other: Hepatic steatosis.  IMPRESSION: Mild pelvicaliectasis on the left.  Nonspecific in the setting of pregnancy however more prominent than the recent comparison. No shadowing calculi or ureteral dilatation. However, slight asymmetry of the left UVJ raises possibility of a tiny left UVJ stone.  No sonographically detectable renal calculi.  Original Report Authenticated By: Waneta Martins, M.D.   Results for orders placed during the hospital encounter of 05/21/12 (from the past 24 hour(s))  URINALYSIS,  ROUTINE W REFLEX MICROSCOPIC     Status: Abnormal   Collection Time   05/21/12  2:15 AM      Component Value Range   Color, Urine YELLOW  YELLOW   APPearance CLEAR  CLEAR   Specific Gravity, Urine 1.025  1.005 - 1.030   pH 6.0  5.0 - 8.0   Glucose, UA NEGATIVE  NEGATIVE mg/dL   Hgb urine dipstick TRACE (*) NEGATIVE   Bilirubin Urine NEGATIVE  NEGATIVE   Ketones, ur 15 (*) NEGATIVE mg/dL   Protein, ur NEGATIVE  NEGATIVE mg/dL   Urobilinogen, UA 0.2  0.0 - 1.0 mg/dL   Nitrite NEGATIVE  NEGATIVE   Leukocytes, UA NEGATIVE  NEGATIVE  URINE MICROSCOPIC-ADD ON     Status: Normal   Collection Time   05/21/12  2:15 AM      Component Value Range   Squamous Epithelial / LPF RARE  RARE   WBC, UA 0-2  <3 WBC/hpf   RBC / HPF 0-2  <3 RBC/hpf   Bacteria, UA RARE  RARE      Assessment and Plan  A:  [facility] at [redacted]w[redacted]d      Possible left kidney stone at UV junction  P:  Admit per Dr Henderson Cloud      IV hydration      PCA Dilaudid  Wynelle Bourgeois 05/21/2012, 6:20 AM

## 2012-05-21 NOTE — H&P (Signed)
Natalie Andrews is an 31 y.o. female with 3 days of left flank pain. Oral medication no longer working.  Vomiting all day yesterday. No shaking chills. Small amount of blood in urine yesterday. No history of kidney stones. Pregnant @ 17 3/7 weeks.  Pertinent Gynecological History: Menses: pregnant Bleeding: N/A Contraception: none DES exposure: unknown Blood transfusions: none Sexually transmitted diseases: no past history Previous GYN Procedures: none  Last mammogram: none Date: none Last pap: normal Date: 2013 OB History: G4, P1   Menstrual History: Menarche age: N/A Patient's last menstrual period was 01/15/2012.    Past Medical History  Diagnosis Date  . Anxiety   . Pregnancy induced hypertension   . Diabetes mellitus   . Headache     migraines  . Gestational diabetes     Past Surgical History  Procedure Date  . Pilonidal cyst excision 2002  . Tonsillectomy 1989  . Fracture surgery 1999    ankle    Family History  Problem Relation Age of Onset  . Arthritis Mother   . Depression Mother   . Miscarriages / [place] Mother   . Diabetes Father   . Early death Father   . Kidney disease Father   . Vision loss Father   . Arthritis Son   . Other Neg Hx     Social History:  reports that she quit smoking about 5 years ago. She does not have any smokeless tobacco history on file. She reports that she does not drink alcohol or use illicit drugs.  Allergies:  Allergies  Allergen Reactions  . Prednisone Hives    Prescriptions prior to admission  Medication Sig Dispense Refill  . Prenatal Vit-Fe Fumarate-FA (PRENATAL MULTIVITAMIN) TABS Take 1 tablet by mouth at bedtime.       Marland Kitchen venlafaxine XR (EFFEXOR-XR) 150 MG 24 hr capsule Take 150 mg by mouth at bedtime.       Marland Kitchen HYDROmorphone (DILAUDID) 2 MG tablet Take 1 tablet (2 mg total) by mouth every 6 (six) hours as needed for pain.  30 tablet  0    Review of Systems  Constitutional: Negative for fever.    Gastrointestinal: Positive for vomiting.       Vomiting all day yesterday  Genitourinary: Positive for hematuria and flank pain.       Left flank pain for last 3 days Trace of gross blood yesterday    Blood pressure 130/74, pulse 100, temperature 97.7 F (36.5 C), temperature source Oral, resp. rate 24, last menstrual period 01/15/2012, SpO2 99.00%. Physical Exam  Constitutional: She is oriented to person, place, and time. She appears well-developed and well-nourished. She appears distressed.       Appears uncomfortable  GI: There is no tenderness.  Neurological: She is oriented to person, place, and time.    Results for orders placed during the hospital encounter of 05/21/12 (from the past 24 hour(s))  URINALYSIS, ROUTINE W REFLEX MICROSCOPIC     Status: Abnormal   Collection Time   05/21/12  2:15 AM      Component Value Range   Color, Urine YELLOW  YELLOW   APPearance CLEAR  CLEAR   Specific Gravity, Urine 1.025  1.005 - 1.030   pH 6.0  5.0 - 8.0   Glucose, UA NEGATIVE  NEGATIVE mg/dL   Hgb urine dipstick TRACE (*) NEGATIVE   Bilirubin Urine NEGATIVE  NEGATIVE   Ketones, ur 15 (*) NEGATIVE mg/dL   Protein, ur NEGATIVE  NEGATIVE mg/dL   Urobilinogen, UA  0.2  0.0 - 1.0 mg/dL   Nitrite NEGATIVE  NEGATIVE   Leukocytes, UA NEGATIVE  NEGATIVE  URINE MICROSCOPIC-ADD ON     Status: Normal   Collection Time   05/21/12  2:15 AM      Component Value Range   Squamous Epithelial / LPF RARE  RARE   WBC, UA 0-2  <3 WBC/hpf   RBC / HPF 0-2  <3 RBC/hpf   Bacteria, UA RARE  RARE    US Renal  05/21/2012  *RADIOLOGY REPORT*  Clinical Data: Recurrent left flank pain.  RENAL/URINARY TRACT ULTRASOUND COMPLETE  Comparison:  05/18/2012  Findings:  Right Kidney:  Measures 12.6 cm.  No hydronephrosis or focal abnormality.  Left Kidney:  Measures 11.9 cm. Mild pelvicaliectasis.  No focal abnormality.  Bladder:  Normal right ureteral jet.  Left ureteral jet poorly visualized. No shadowing calculi  however there is asymmetric fullness in the region of the left UVJ which raises possibility of a tiny left UVJ stone. Bladder otherwise unremarkable.  No ureteral dilatation visualized.  Other: Hepatic steatosis.  IMPRESSION: Mild pelvicaliectasis on the left.  Nonspecific in the setting of pregnancy however more prominent than the recent comparison. No shadowing calculi or ureteral dilatation. However, slight asymmetry of the left UVJ raises possibility of a tiny left UVJ stone.  No sonographically detectable renal calculi.  Original Report Authenticated By: Waneta Martins, M.D.    Assessment/Plan: 31 yo G4P1 at 85 3/7 weeks with probable left kidney stone at UVJ. Outpatient management not working.  Will hydrate with IV and give IV pain meds.  Kanaan Kagawa II,Brynna Dobos E 05/21/2012, 7:26 AM

## 2012-05-22 DIAGNOSIS — N200 Calculus of kidney: Secondary | ICD-10-CM

## 2012-05-22 DIAGNOSIS — Z3490 Encounter for supervision of normal pregnancy, unspecified, unspecified trimester: Secondary | ICD-10-CM

## 2012-05-22 MED ORDER — OXYCODONE-ACETAMINOPHEN 5-325 MG PO TABS
1.0000 | ORAL_TABLET | ORAL | Status: DC | PRN
  Administered 2012-05-22 – 2012-05-23 (×3): 2 via ORAL
  Filled 2012-05-22 (×3): qty 2

## 2012-05-22 NOTE — Progress Notes (Signed)
Pt feeling better, IV PCA controlling pain.  No n/v.  No ctx, vb or lof.    AF, VSS Gen - NAD Abd - soft, NT.  No CVA tenderness Ext - no edema  A/P:  Will try to transition from PCA to PO pain meds today Continue IVF.

## 2012-05-22 NOTE — Progress Notes (Signed)
All urine strained, no stone seen as of yet

## 2012-05-23 MED ORDER — OXYCODONE-ACETAMINOPHEN 5-325 MG PO TABS: 1.0000 | ORAL_TABLET | ORAL | Status: AC | PRN

## 2012-05-23 NOTE — Discharge Summary (Signed)
Admission Diagnosis: IUP Renal colic   Discharge Diagnosis: Same  Hospital course: 31 year old G 4 P 1021 at 51 w 5 days presents with renal colic. She had some hematuria on admission. Patient was given IVF and pain meds and her pain diminished a lot.  She was discharged home in good condition. She was given a RX for Percocet She will follow Monday in our office. She was given discharge instructions.

## 2012-05-23 NOTE — Progress Notes (Signed)
Patient is feeling better this am. No dysuria. Pain is decreased. Has not seen a stone yet.  Afebrile  Vital signs stable Abdomen is soft and non tender  IMPRESSION: IUP  Renal colic  PLAN: Improved Discharge home Rx Percocet Follow up on Monday She will call me tomorrow to arrange Urology appointment.

## 2012-05-23 NOTE — Progress Notes (Signed)
Pt discharged to home via ambulation.

## 2012-07-11 ENCOUNTER — Encounter (HOSPITAL_COMMUNITY): Payer: Self-pay | Admitting: *Deleted

## 2012-07-11 ENCOUNTER — Inpatient Hospital Stay (HOSPITAL_COMMUNITY)
Admission: AD | Admit: 2012-07-11 | Discharge: 2012-07-11 | Disposition: A | Discharge status: Home / Self Care | Payer: BC Managed Care – PPO | Source: Ambulatory Visit | Attending: Obstetrics and Gynecology | Admitting: Obstetrics and Gynecology

## 2012-07-11 DIAGNOSIS — O99891 Other specified diseases and conditions complicating pregnancy: Secondary | ICD-10-CM | POA: Insufficient documentation

## 2012-07-11 DIAGNOSIS — O26899 Other specified pregnancy related conditions, unspecified trimester: Secondary | ICD-10-CM

## 2012-07-11 DIAGNOSIS — N898 Other specified noninflammatory disorders of vagina: Secondary | ICD-10-CM

## 2012-07-11 HISTORY — DX: Morbid (severe) obesity due to excess calories: E66.01

## 2012-07-11 LAB — URINALYSIS, ROUTINE W REFLEX MICROSCOPIC
Bilirubin Urine: NEGATIVE
Glucose, UA: NEGATIVE mg/dL
Hgb urine dipstick: NEGATIVE
Ketones, ur: NEGATIVE mg/dL
Leukocytes, UA: NEGATIVE
Nitrite: NEGATIVE
Protein, ur: NEGATIVE mg/dL
Specific Gravity, Urine: <1.005 — ABNORMAL LOW (ref 1.005–1.030)
Urobilinogen, UA: 0.2 mg/dL (ref 0.0–1.0)
pH: 7 (ref 5.0–8.0)

## 2012-07-11 NOTE — MAU Note (Signed)
Pt states she felt a gush. When she went to lay down for a napp

## 2012-07-11 NOTE — MAU Provider Note (Signed)
  History     CSN: [id number]  Arrival date and time: 07/11/12 1625   First Provider Initiated Contact with Patient 07/11/12 1751      Chief Complaint  Patient presents with  . Rupture of Membranes   HPI 31 y.o. [id number] at [redacted]w[redacted]d reporting gush of fluid when sitting down on bed tonight, no continued leaking, + fetal movement, no bleeding or pain.    Past Medical History  Diagnosis Date  . Anxiety   . Pregnancy induced hypertension   . Diabetes mellitus   . Headache     migraines  . Gestational diabetes   . Morbidly obese     Past Surgical History  Procedure Date  . Pilonidal cyst excision 2002  . Tonsillectomy 1989  . Fracture surgery 1999    ankle    Family History  Problem Relation Age of Onset  . Arthritis Mother   . Depression Mother   . Miscarriages / [place] Mother   . Diabetes Father   . Early death Father   . Kidney disease Father   . Vision loss Father   . Arthritis Son   . Other Neg Hx     History  Substance Use Topics  . Smoking status: Former Smoker    Quit date: 05/04/2007  . Smokeless tobacco: Not on file  . Alcohol Use: No    Allergies:  Allergies  Allergen Reactions  . Prednisone Hives    Prescriptions prior to admission  Medication Sig Dispense Refill  . amoxicillin (AMOXIL) 875 MG tablet Take 875 mg by mouth 2 (two) times daily. Sinus infection      . diphenhydramine-acetaminophen (TYLENOL PM) 25-500 MG TABS Take 1 tablet by mouth at bedtime as needed. For sleep      . Prenatal Vit-Fe Fumarate-FA (PRENATAL MULTIVITAMIN) TABS Take 1 tablet by mouth at bedtime.       Marland Kitchen venlafaxine XR (EFFEXOR-XR) 150 MG 24 hr capsule Take 150 mg by mouth at bedtime.         Review of Systems  Constitutional: Negative.   Respiratory: Negative.   Cardiovascular: Negative.   Gastrointestinal: Negative for nausea, vomiting, abdominal pain, diarrhea and constipation.  Genitourinary: Negative for dysuria, urgency, frequency, hematuria and flank  pain.       Negative for vaginal bleeding, cramping/contractions  Musculoskeletal: Negative.   Neurological: Negative.   Psychiatric/Behavioral: Negative.    Physical Exam   Blood pressure 131/69, pulse 91, temperature 98 F (36.7 C), temperature source Oral, resp. rate 18, height 5\' 3"  (1.6 m), weight 319 lb (144.697 kg), last menstrual period 01/15/2012.  Physical Exam  Nursing note and vitals reviewed. Constitutional: She is oriented to person, place, and time. She appears well-developed and well-nourished. No distress.  Cardiovascular: Normal rate.   Respiratory: Effort normal.  Genitourinary: Vaginal discharge (creamy white, no pooling) found.  Musculoskeletal: Normal range of motion.  Neurological: She is alert and oriented to person, place, and time.  Skin: Skin is warm and dry.  Psychiatric: She has a normal mood and affect.   Fern negative FHR reassuring at 24 weeks, TOCO quiet  MAU Course  Procedures    Assessment and Plan  31 y.o. [id number] at [redacted]w[redacted]d Membranes intact F/U as scheduled  Diani Jillson 07/11/2012, 6:31 PM

## 2012-07-11 NOTE — MAU Note (Signed)
Felt a gush when layed down (1530), clear fluid. Nothing since. Now feeling cramping.

## 2012-08-07 ENCOUNTER — Encounter: Payer: BC Managed Care – PPO | Attending: Obstetrics and Gynecology | Admitting: *Deleted

## 2012-08-07 DIAGNOSIS — Z713 Dietary counseling and surveillance: Secondary | ICD-10-CM | POA: Insufficient documentation

## 2012-08-07 DIAGNOSIS — O99810 Abnormal glucose complicating pregnancy: Secondary | ICD-10-CM | POA: Insufficient documentation

## 2012-08-13 ENCOUNTER — Encounter: Payer: Self-pay | Admitting: *Deleted

## 2012-08-13 NOTE — Progress Notes (Signed)
  Patient was seen on 08/07/2012 for Gestational Diabetes self-management class at the [facility]. The following learning objectives were met by the patient during this course:   States the definition of Gestational Diabetes  States why dietary management is important in controlling blood glucose  Describes the effects each nutrient has on blood glucose levels  Demonstrates ability to create a balanced meal plan  Demonstrates carbohydrate counting   States when to check blood glucose levels  Demonstrates proper blood glucose monitoring techniques  States the effect of stress and exercise on blood glucose levels  States the importance of limiting caffeine and abstaining from alcohol and smoking  Blood glucose monitor given: [employer] Kit Lot # [id number] Exp: 03/2013 Blood glucose reading: 96 mg/dl  Patient instructed to monitor glucose levels: FBS: 60 - <90 2 hour: <120  *Patient received handouts:  Nutrition Diabetes and Pregnancy  Carbohydrate Counting List  Patient will be seen for follow-up as needed.

## 2012-08-13 NOTE — Patient Instructions (Signed)
Goals:  Check glucose levels per MD as instructed  Follow Gestational Diabetes Diet as instructed  Call for follow-up as needed    

## 2012-10-15 ENCOUNTER — Telehealth (HOSPITAL_COMMUNITY): Payer: Self-pay | Admitting: *Deleted

## 2012-10-15 ENCOUNTER — Encounter (HOSPITAL_COMMUNITY): Payer: Self-pay | Admitting: *Deleted

## 2012-10-15 NOTE — Telephone Encounter (Signed)
Preadmission screen  

## 2012-10-15 NOTE — Telephone Encounter (Signed)
Add gbs status

## 2012-10-21 ENCOUNTER — Observation Stay (HOSPITAL_COMMUNITY): Payer: BC Managed Care – PPO | Admitting: Anesthesiology

## 2012-10-21 ENCOUNTER — Inpatient Hospital Stay (HOSPITAL_COMMUNITY)
Admission: AD | Admit: 2012-10-21 | Discharge: 2012-10-23 | DRG: 372 | Disposition: A | Discharge status: Home / Self Care | Payer: BC Managed Care – PPO | Source: Ambulatory Visit | Attending: Obstetrics and Gynecology | Admitting: Obstetrics and Gynecology

## 2012-10-21 ENCOUNTER — Encounter (HOSPITAL_COMMUNITY): Payer: Self-pay | Admitting: *Deleted

## 2012-10-21 ENCOUNTER — Encounter (HOSPITAL_COMMUNITY): Payer: Self-pay | Admitting: Anesthesiology

## 2012-10-21 DIAGNOSIS — Z3490 Encounter for supervision of normal pregnancy, unspecified, unspecified trimester: Secondary | ICD-10-CM

## 2012-10-21 DIAGNOSIS — O139 Gestational [pregnancy-induced] hypertension without significant proteinuria, unspecified trimester: Principal | ICD-10-CM | POA: Diagnosis present

## 2012-10-21 DIAGNOSIS — N200 Calculus of kidney: Secondary | ICD-10-CM

## 2012-10-21 DIAGNOSIS — O99814 Abnormal glucose complicating childbirth: Secondary | ICD-10-CM | POA: Diagnosis present

## 2012-10-21 LAB — CBC
HCT: 36.3 % (ref 36.0–46.0)
MCH: 28.9 pg (ref 26.0–34.0)
MCHC: 33.9 g/dL (ref 30.0–36.0)
MCV: 85.4 fL (ref 78.0–100.0)
Platelets: 164 10*3/uL (ref 150–400)
RDW: 16.6 % — ABNORMAL HIGH (ref 11.5–15.5)
WBC: 12.3 10*3/uL — ABNORMAL HIGH (ref 4.0–10.5)

## 2012-10-21 LAB — TYPE AND SCREEN
ABO/RH(D): A POS
Unit division: 0

## 2012-10-21 LAB — LACTATE DEHYDROGENASE: LDH: 188 U/L (ref 94–250)

## 2012-10-21 LAB — COMPREHENSIVE METABOLIC PANEL
Albumin: 2.9 g/dL — ABNORMAL LOW (ref 3.5–5.2)
BUN: 7 mg/dL (ref 6–23)
Calcium: 9.2 mg/dL (ref 8.4–10.5)
Chloride: 102 mEq/L (ref 96–112)
Creatinine, Ser: 0.63 mg/dL (ref 0.50–1.10)
Total Bilirubin: 0.3 mg/dL (ref 0.3–1.2)

## 2012-10-21 LAB — ABO/RH: ABO/RH(D): A POS

## 2012-10-21 LAB — URIC ACID: Uric Acid, Serum: 6.5 mg/dL (ref 2.4–7.0)

## 2012-10-21 MED ORDER — IBUPROFEN 600 MG PO TABS
600.0000 mg | ORAL_TABLET | Freq: Four times a day (QID) | ORAL | Status: DC | PRN
  Administered 2012-10-22: 600 mg via ORAL
  Filled 2012-10-21: qty 1

## 2012-10-21 MED ORDER — PHENYLEPHRINE 40 MCG/ML (10ML) SYRINGE FOR IV PUSH (FOR BLOOD PRESSURE SUPPORT): 80.0000 ug | PREFILLED_SYRINGE | INTRAVENOUS | Status: DC | PRN

## 2012-10-21 MED ORDER — EPHEDRINE 5 MG/ML INJ
10.0000 mg | INTRAVENOUS | Status: DC | PRN
  Filled 2012-10-21: qty 4

## 2012-10-21 MED ORDER — LACTATED RINGERS IV SOLN: 500.0000 mL | Freq: Once | INTRAVENOUS | Status: DC

## 2012-10-21 MED ORDER — ONDANSETRON HCL 4 MG/2ML IJ SOLN: 4.0000 mg | Freq: Four times a day (QID) | INTRAMUSCULAR | Status: DC | PRN

## 2012-10-21 MED ORDER — DIPHENHYDRAMINE HCL 50 MG/ML IJ SOLN: 12.5000 mg | INTRAMUSCULAR | Status: DC | PRN

## 2012-10-21 MED ORDER — TERBUTALINE SULFATE 1 MG/ML IJ SOLN: 0.2500 mg | Freq: Once | INTRAMUSCULAR | Status: AC | PRN

## 2012-10-21 MED ORDER — EPHEDRINE 5 MG/ML INJ: 10.0000 mg | INTRAVENOUS | Status: DC | PRN

## 2012-10-21 MED ORDER — OXYTOCIN 40 UNITS IN LACTATED RINGERS INFUSION - SIMPLE MED: 62.5000 mL/h | INTRAVENOUS | Status: DC

## 2012-10-21 MED ORDER — LACTATED RINGERS IV SOLN
500.0000 mL | INTRAVENOUS | Status: DC | PRN
  Administered 2012-10-21: 500 mL via INTRAVENOUS

## 2012-10-21 MED ORDER — OXYTOCIN 40 UNITS IN LACTATED RINGERS INFUSION - SIMPLE MED
1.0000 m[IU]/min | INTRAVENOUS | Status: DC
  Administered 2012-10-21: 1 m[IU]/min via INTRAVENOUS
  Filled 2012-10-21: qty 1000

## 2012-10-21 MED ORDER — FENTANYL 2.5 MCG/ML BUPIVACAINE 1/10 % EPIDURAL INFUSION (WH - ANES)
INTRAMUSCULAR | Status: DC | PRN
  Administered 2012-10-21: 14 mL/h via EPIDURAL

## 2012-10-21 MED ORDER — LACTATED RINGERS IV SOLN
INTRAVENOUS | Status: DC
  Administered 2012-10-21 (×3): via INTRAVENOUS

## 2012-10-21 MED ORDER — PHENYLEPHRINE 40 MCG/ML (10ML) SYRINGE FOR IV PUSH (FOR BLOOD PRESSURE SUPPORT)
80.0000 ug | PREFILLED_SYRINGE | INTRAVENOUS | Status: DC | PRN
  Filled 2012-10-21: qty 5

## 2012-10-21 MED ORDER — LIDOCAINE HCL (PF) 1 % IJ SOLN
30.0000 mL | INTRAMUSCULAR | Status: DC | PRN
  Filled 2012-10-21: qty 30

## 2012-10-21 MED ORDER — BUTORPHANOL TARTRATE 1 MG/ML IJ SOLN: 1.0000 mg | INTRAMUSCULAR | Status: DC | PRN

## 2012-10-21 MED ORDER — OXYTOCIN BOLUS FROM INFUSION
500.0000 mL | INTRAVENOUS | Status: DC
  Administered 2012-10-22: 500 mL via INTRAVENOUS

## 2012-10-21 MED ORDER — CITRIC ACID-SODIUM CITRATE 334-500 MG/5ML PO SOLN
30.0000 mL | ORAL | Status: DC | PRN
  Administered 2012-10-21: 30 mL via ORAL
  Filled 2012-10-21: qty 15

## 2012-10-21 MED ORDER — FENTANYL 2.5 MCG/ML BUPIVACAINE 1/10 % EPIDURAL INFUSION (WH - ANES)
14.0000 mL/h | INTRAMUSCULAR | Status: DC
  Filled 2012-10-21: qty 125

## 2012-10-21 MED ORDER — ACETAMINOPHEN 325 MG PO TABS: 650.0000 mg | ORAL_TABLET | ORAL | Status: DC | PRN

## 2012-10-21 MED ORDER — SODIUM BICARBONATE 8.4 % IV SOLN
INTRAVENOUS | Status: DC | PRN
  Administered 2012-10-21: 5 mL via EPIDURAL

## 2012-10-21 MED ORDER — OXYCODONE-ACETAMINOPHEN 5-325 MG PO TABS: 1.0000 | ORAL_TABLET | ORAL | Status: DC | PRN

## 2012-10-21 MED ORDER — VENLAFAXINE HCL ER 150 MG PO CP24
150.0000 mg | ORAL_CAPSULE | Freq: Every day | ORAL | Status: DC
  Filled 2012-10-21: qty 1

## 2012-10-21 NOTE — Anesthesia Preprocedure Evaluation (Signed)
Anesthesia Evaluation  Patient identified by MRN, date of birth, ID band Patient awake    Reviewed: Allergy & Precautions, H&P , Patient's Chart, lab work & pertinent test results  Airway Mallampati: III TM Distance: >3 FB Neck ROM: full    Dental  (+) Teeth Intact   Pulmonary  breath sounds clear to auscultation        Cardiovascular Rhythm:regular Rate:Normal     Neuro/Psych    GI/Hepatic   Endo/Other  diabetesMorbid obesity  Renal/GU      Musculoskeletal   Abdominal   Peds  Hematology   Anesthesia Other Findings       Reproductive/Obstetrics (+) Pregnancy                           Anesthesia Physical Anesthesia Plan  ASA: III  Anesthesia Plan: Epidural   Post-op Pain Management:    Induction:   Airway Management Planned:   Additional Equipment:   Intra-op Plan:   Post-operative Plan:   Informed Consent: I have reviewed the patients History and Physical, chart, labs and discussed the procedure including the risks, benefits and alternatives for the proposed anesthesia with the patient or authorized representative who has indicated his/her understanding and acceptance.   Dental Advisory Given  Plan Discussed with:   Anesthesia Plan Comments: (Labs checked- platelets confirmed with RN in room. Fetal heart tracing, per RN, reported to be stable enough for sitting procedure. Discussed epidural, and patient consents to the procedure:  included risk of possible headache,backache, failed block, allergic reaction, and nerve injury. This patient was asked if she had any questions or concerns before the procedure started. )        Anesthesia Quick Evaluation  

## 2012-10-21 NOTE — Progress Notes (Signed)
FHT reactive UC q2-4 min Cx 6/90/-2/LOA IUPC placed

## 2012-10-21 NOTE — Anesthesia Procedure Notes (Signed)

## 2012-10-21 NOTE — H&P (Signed)
Emilynn Esther is a 31 y.o. female presenting for induction of labor secondary to [facility].  BPs getting more labile over past 1-2 weeks.  BP of 140s/90s in office with trace protein.. No HA or blurry vision or epigastric pain.  Pregnancy complicated by GDM on glyburide. Maternal Medical History:  Fetal activity: Perceived fetal activity is normal.      OB History    Grav Para Term Preterm Abortions TAB SAB Ect Mult Living   4 1 1  0 2 0 2 0 0 1     Past Medical History  Diagnosis Date  . Anxiety   . Diabetes mellitus   . Headache     migraines  . Morbidly obese   . H/O varicella   . IBS (irritable bowel syndrome)   . Depression     mild pp depression  . Gestational diabetes     glyburide  . Pregnancy induced hypertension     last pregnancy none this time   Past Surgical History  Procedure Date  . Pilonidal cyst excision 2002  . Tonsillectomy 1989  . Fracture surgery 1999    ankle  . Laparoscopic ovarian cystectomy     right   Family History: family history includes Arthritis in her son; Depression in her mother; Diabetes in her father; Early death in her father; Kidney disease in her father; Miscarriages / Stillbirths in her mother; Rheum arthritis in her mother; and Vision loss in her father.  There is no history of Other. Social History:  reports that she quit smoking about 5 years ago. She does not have any smokeless tobacco history on file. She reports that she does not drink alcohol or use illicit drugs.   Prenatal Transfer Tool  Maternal Diabetes: Yes:  Diabetes Type:  Insulin/Medication controlled Genetic Screening: Normal Maternal Ultrasounds/Referrals: Normal Fetal Ultrasounds or other Referrals:  None Maternal Substance Abuse:  No Significant Maternal Medications:  None Significant Maternal Lab Results:  None Other Comments:  None  Review of Systems  Eyes: Negative for blurred vision.  Gastrointestinal: Negative for abdominal pain.  Neurological: Negative for  headaches.      Blood pressure 129/65, pulse 100, temperature 98.5 F (36.9 C), temperature source Axillary, resp. rate 18, height 5\' 4"  (1.626 m), weight 330 lb (149.687 kg), last menstrual period 01/15/2012. Maternal Exam:  Uterine Assessment: Contraction strength is mild.  Contraction frequency is irregular.      Fetal Exam Fetal Monitor Review: Pattern: accelerations present.       Physical Exam  Cardiovascular: Normal rate and regular rhythm.   Respiratory: Effort normal and breath sounds normal.  GI: There is no tenderness.  Neurological: She has normal reflexes.   Cx  3-4/70/-2/vtx/soft/mid AROM-vtx well applied-no fluid noted Prenatal labs: ABO, Rh: A/Positive/-- (04/23 0000) Antibody: Negative (04/23 0000) Rubella: Immune (04/23 0000) RPR: Nonreactive (04/23 0000)  HBsAg: Negative (04/23 0000)  HIV: Non-reactive (04/23 0000)  GBS: Negative (11/07 0000)   Assessment/Plan: 31 yo G4P1 at term with labile BPs  GDM Will begin pitocin augmentation D/W patient patient and husband above and reviewed risks   Yael Angerer II,Jlen Wintle E 10/21/2012, 1:31 PM

## 2012-10-21 NOTE — Progress Notes (Signed)
Cx 2-3/90/0 UCs q2-3 min IUPC placed Epidural in

## 2012-10-22 ENCOUNTER — Encounter (HOSPITAL_COMMUNITY): Payer: Self-pay | Admitting: Obstetrics

## 2012-10-22 LAB — CBC
MCH: 28.6 pg (ref 26.0–34.0)
MCHC: 33.5 g/dL (ref 30.0–36.0)
Platelets: 166 10*3/uL (ref 150–400)

## 2012-10-22 LAB — RPR: RPR Ser Ql: NONREACTIVE

## 2012-10-22 MED ORDER — ZOLPIDEM TARTRATE 5 MG PO TABS: 5.0000 mg | ORAL_TABLET | Freq: Every evening | ORAL | Status: DC | PRN

## 2012-10-22 MED ORDER — SENNOSIDES-DOCUSATE SODIUM 8.6-50 MG PO TABS
2.0000 | ORAL_TABLET | Freq: Every day | ORAL | Status: DC
  Administered 2012-10-22: 2 via ORAL

## 2012-10-22 MED ORDER — CEFAZOLIN SODIUM-DEXTROSE 2-3 GM-% IV SOLR
2.0000 g | Freq: Four times a day (QID) | INTRAVENOUS | Status: AC
  Administered 2012-10-22 (×2): 2 g via INTRAVENOUS
  Filled 2012-10-22 (×2): qty 50

## 2012-10-22 MED ORDER — OXYCODONE-ACETAMINOPHEN 5-325 MG PO TABS
1.0000 | ORAL_TABLET | ORAL | Status: DC | PRN
  Administered 2012-10-22 (×4): 1 via ORAL
  Filled 2012-10-22 (×4): qty 1

## 2012-10-22 MED ORDER — BENZOCAINE-MENTHOL 20-0.5 % EX AERO
1.0000 "application " | INHALATION_SPRAY | CUTANEOUS | Status: DC | PRN
  Administered 2012-10-22: 1 via TOPICAL
  Filled 2012-10-22: qty 56

## 2012-10-22 MED ORDER — FLEET ENEMA 7-19 GM/118ML RE ENEM: 1.0000 | ENEMA | Freq: Every day | RECTAL | Status: DC | PRN

## 2012-10-22 MED ORDER — DIBUCAINE 1 % RE OINT: 1.0000 "application " | TOPICAL_OINTMENT | RECTAL | Status: DC | PRN

## 2012-10-22 MED ORDER — PRENATAL MULTIVITAMIN CH
1.0000 | ORAL_TABLET | Freq: Every day | ORAL | Status: DC
  Administered 2012-10-22 – 2012-10-23 (×2): 1 via ORAL
  Filled 2012-10-22 (×2): qty 1

## 2012-10-22 MED ORDER — ONDANSETRON HCL 4 MG/2ML IJ SOLN: 4.0000 mg | INTRAMUSCULAR | Status: DC | PRN

## 2012-10-22 MED ORDER — DIPHENHYDRAMINE HCL 25 MG PO CAPS: 25.0000 mg | ORAL_CAPSULE | Freq: Four times a day (QID) | ORAL | Status: DC | PRN

## 2012-10-22 MED ORDER — LANOLIN HYDROUS EX OINT: TOPICAL_OINTMENT | CUTANEOUS | Status: DC | PRN

## 2012-10-22 MED ORDER — IBUPROFEN 600 MG PO TABS
600.0000 mg | ORAL_TABLET | Freq: Four times a day (QID) | ORAL | Status: DC
  Administered 2012-10-22 – 2012-10-23 (×6): 600 mg via ORAL
  Filled 2012-10-22 (×6): qty 1

## 2012-10-22 MED ORDER — TETANUS-DIPHTH-ACELL PERTUSSIS 5-2.5-18.5 LF-MCG/0.5 IM SUSP: 0.5000 mL | Freq: Once | INTRAMUSCULAR | Status: DC

## 2012-10-22 MED ORDER — BISACODYL 10 MG RE SUPP: 10.0000 mg | Freq: Every day | RECTAL | Status: DC | PRN

## 2012-10-22 MED ORDER — WITCH HAZEL-GLYCERIN EX PADS: 1.0000 "application " | MEDICATED_PAD | CUTANEOUS | Status: DC | PRN

## 2012-10-22 MED ORDER — ONDANSETRON HCL 4 MG PO TABS: 4.0000 mg | ORAL_TABLET | ORAL | Status: DC | PRN

## 2012-10-22 MED ORDER — SIMETHICONE 80 MG PO CHEW: 80.0000 mg | CHEWABLE_TABLET | ORAL | Status: DC | PRN

## 2012-10-22 NOTE — Progress Notes (Signed)
Post Partum Day 0 Subjective: no complaints, up ad lib, voiding, tolerating PO and + flatus  Objective: Blood pressure 110/74, pulse 89, temperature 98.3 F (36.8 C), temperature source Oral, resp. rate 20, height 5\' 4"  (1.626 m), weight 149.687 kg (330 lb), last menstrual period 01/15/2012, SpO2 97.00%, unknown if currently breastfeeding.  Physical Exam:  General: alert and cooperative Lochia: appropriate Uterine Fundus: firm Incision: perineum intact DVT Evaluation: No evidence of DVT seen on physical exam. No cords or calf tenderness. No significant calf/ankle edema.   Basename 10/22/12 0040 10/21/12 1300  HGB 11.6* 12.3  HCT 34.6* 36.3  Blood sugars-66-79 mg/dl  Assessment/Plan: Plan for discharge tomorrow   LOS: 1 day   Natalie Andrews G 10/22/2012, 8:02 AM

## 2012-10-22 NOTE — Anesthesia Postprocedure Evaluation (Signed)
  Anesthesia Post-op Note  Patient: Natalie Andrews  Procedure(s) Performed: * No procedures listed *  Patient Location: Mother/Baby  Anesthesia Type:Epidural  Level of Consciousness: awake, alert  and oriented  Airway and Oxygen Therapy: Patient Spontanous Breathing  Post-op Pain: mild  Post-op Assessment: Patient's Cardiovascular Status Stable, Respiratory Function Stable, No headache, No backache, No residual numbness and No residual motor weakness  Post-op Vital Signs: stable  Complications: No apparent anesthesia complications

## 2012-10-22 NOTE — Progress Notes (Signed)
Delivery Note Rapid second stage SVD VFI   Apgars 9/10 Weight pending Placenta 3 vessels, manually extracted, uterus clean, to path EBL 350cc Patient baby stable in LDR Ancef ordered

## 2012-10-23 LAB — CBC
MCH: 28.1 pg (ref 26.0–34.0)
MCHC: 32.4 g/dL (ref 30.0–36.0)
Platelets: 145 10*3/uL — ABNORMAL LOW (ref 150–400)
RBC: 3.91 MIL/uL (ref 3.87–5.11)
RDW: 16.8 % — ABNORMAL HIGH (ref 11.5–15.5)

## 2012-10-23 LAB — GLUCOSE, CAPILLARY: Glucose-Capillary: 97 mg/dL (ref 70–99)

## 2012-10-23 MED ORDER — OXYCODONE-ACETAMINOPHEN 5-325 MG PO TABS: 1.0000 | ORAL_TABLET | ORAL | Status: DC | PRN

## 2012-10-23 MED ORDER — IBUPROFEN 600 MG PO TABS: 600.0000 mg | ORAL_TABLET | Freq: Four times a day (QID) | ORAL | Status: DC

## 2012-10-23 NOTE — Discharge Summary (Signed)
Obstetric Discharge Summary Reason for Admission: induction of labor Prenatal Procedures: ultrasound Intrapartum Procedures: spontaneous vaginal delivery Postpartum Procedures: none Complications-Operative and Postpartum: none Hemoglobin  Date Value Range Status  10/23/2012 11.0* 12.0 - 15.0 g/dL Final     HCT  Date Value Range Status  10/23/2012 33.9* 36.0 - 46.0 % Final    Physical Exam:  General: alert and cooperative Lochia: appropriate Uterine Fundus: firm Incision: perineum intact DVT Evaluation: No evidence of DVT seen on physical exam. No significant calf/ankle edema.  Discharge Diagnoses: Term Pregnancy-delivered  Discharge Information: Date: 10/23/2012 Activity: pelvic rest Diet: routine Medications: PNV, Ibuprofen and Percocet Condition: stable Instructions: refer to practice specific booklet Discharge to: home   Newborn Data: Live born female  Birth Weight: 8 lb 7.1 oz (3830 g) APGAR: 9, 10  Home with mother.  Tayleigh Wetherell G 10/23/2012, 8:14 AM

## 2012-10-23 NOTE — Progress Notes (Signed)
Pt discharged before CSW could assess history of anxiety/depression. 

## 2012-10-25 ENCOUNTER — Inpatient Hospital Stay (HOSPITAL_COMMUNITY): Admission: RE | Admit: 2012-10-25 | Payer: BC Managed Care – PPO | Source: Ambulatory Visit

## 2012-10-26 ENCOUNTER — Inpatient Hospital Stay (HOSPITAL_COMMUNITY): Admission: RE | Admit: 2012-10-26 | Payer: BC Managed Care – PPO | Source: Ambulatory Visit

## 2014-01-16 IMAGING — US US RENAL
1 series · 14 of 25 positions shown · non-contrast
Comparison: No priors.

CLINICAL DATA: Acute onset of left-sided flank pain.
Hemoglobinuria the.

RENAL/URINARY TRACT ULTRASOUND COMPLETE

[Series 1: us renal · 14 of 28 slices shown]
[im 1/28]
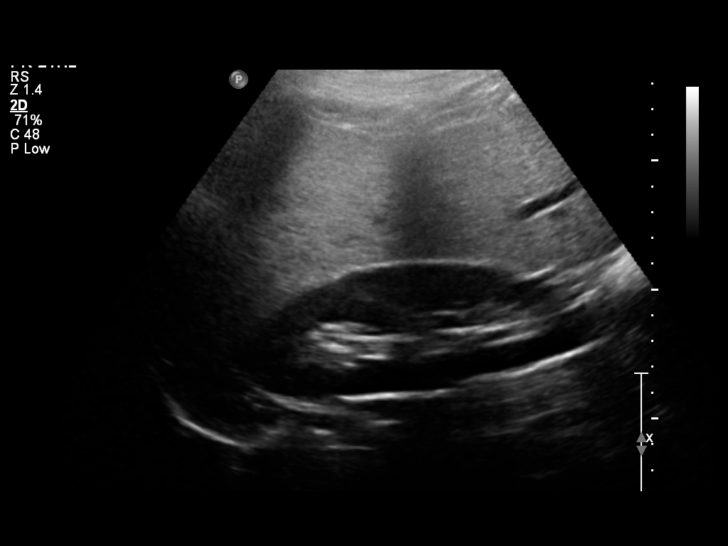
[im 3/28]
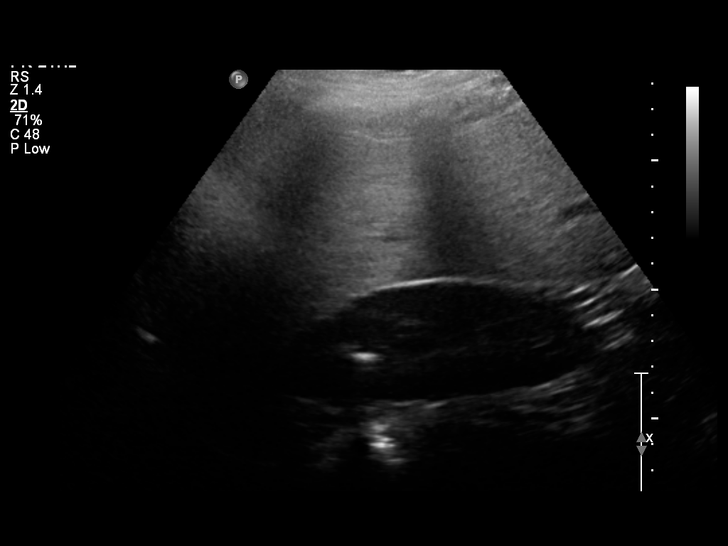
[im 5/28]
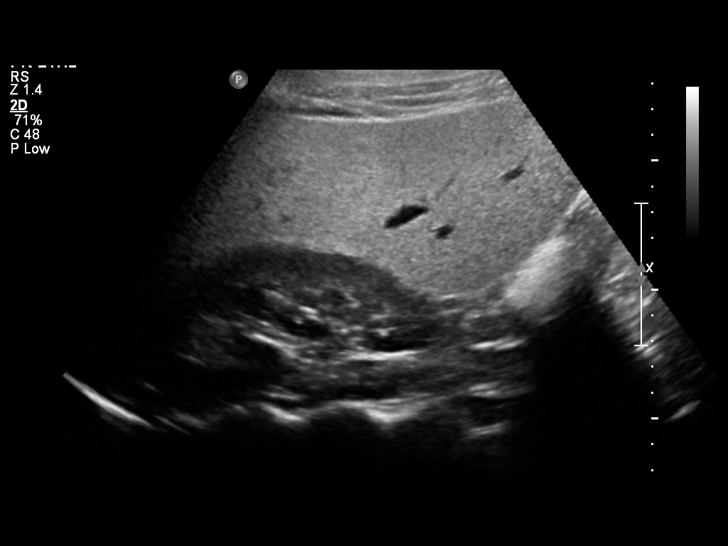
[im 7/28]
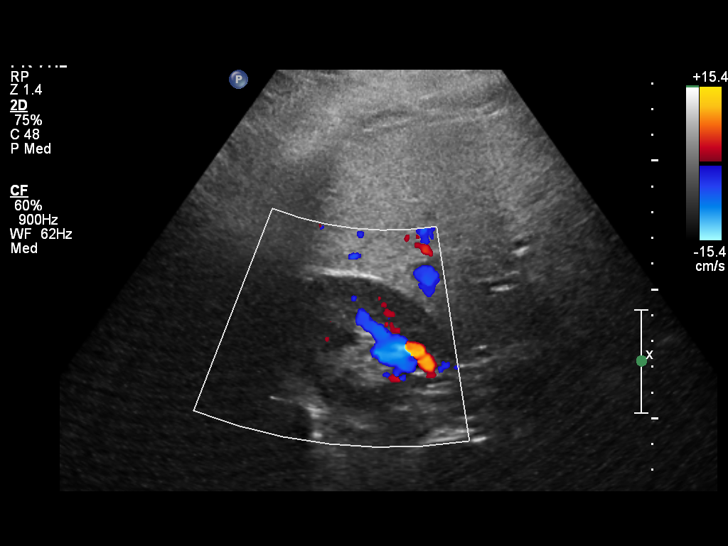
[im 10/28]
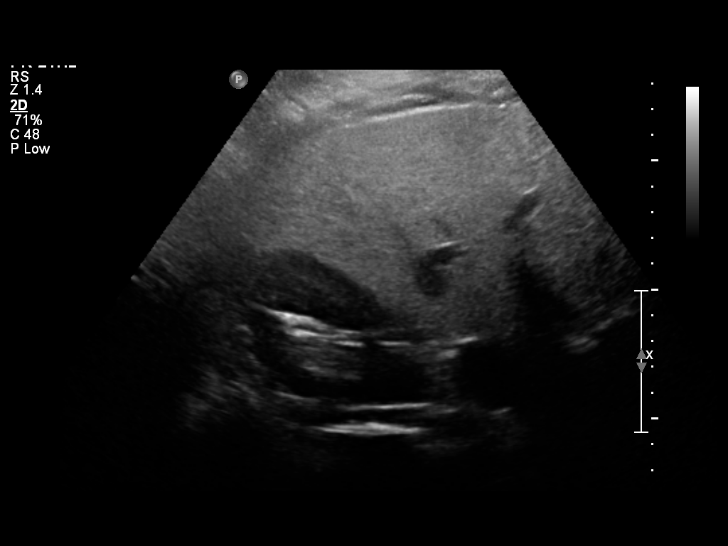
[im 11/28]
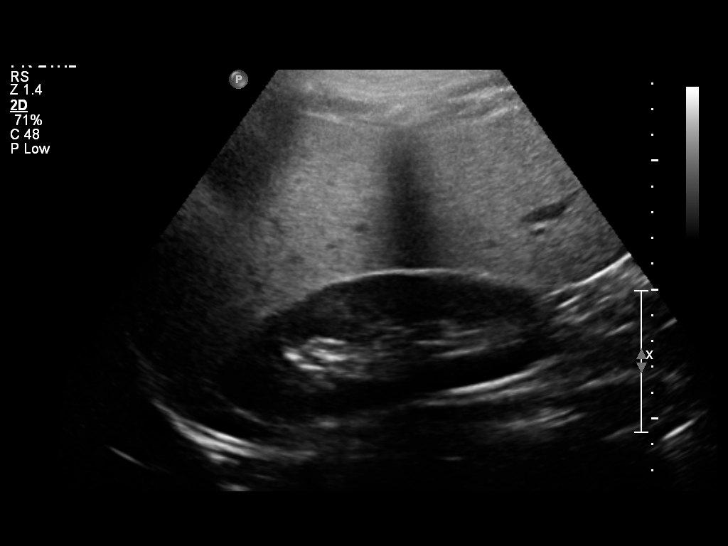
[im 13/28]
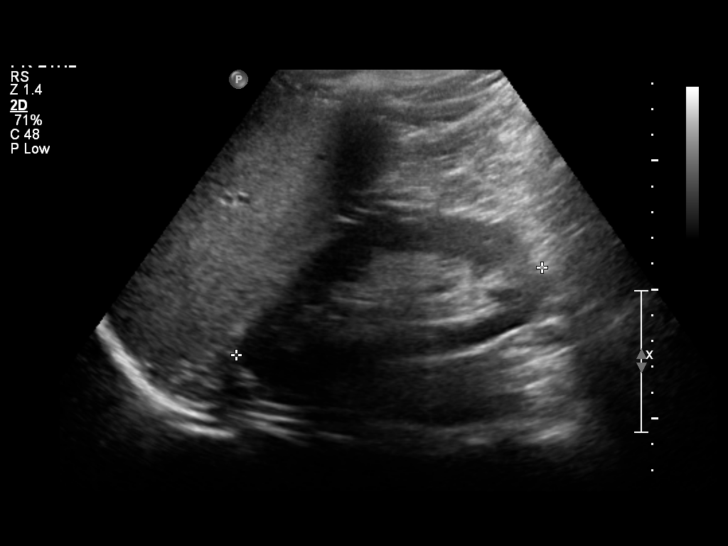
[im 15/28]
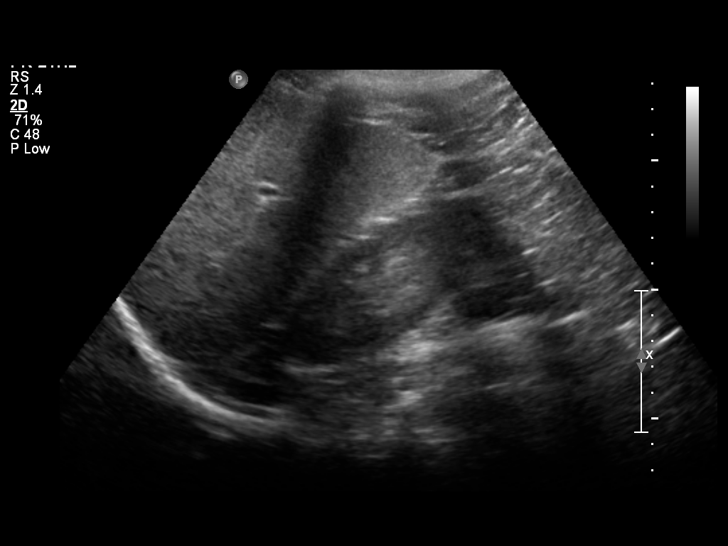
[im 17/28]
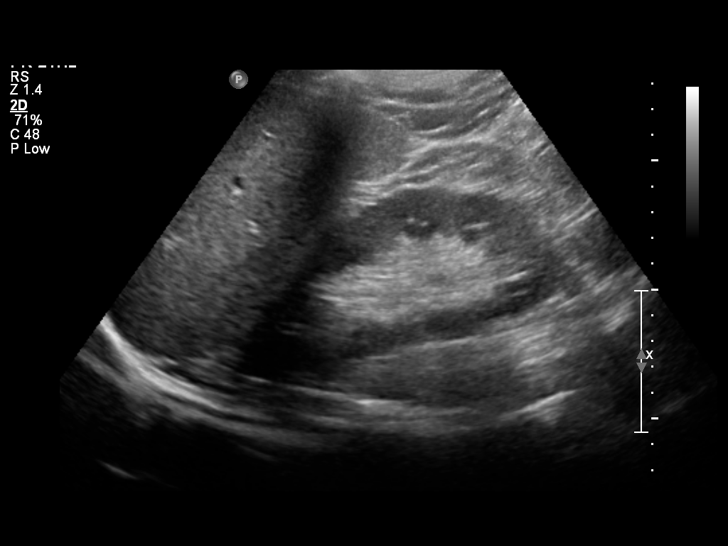
[im 19/28]
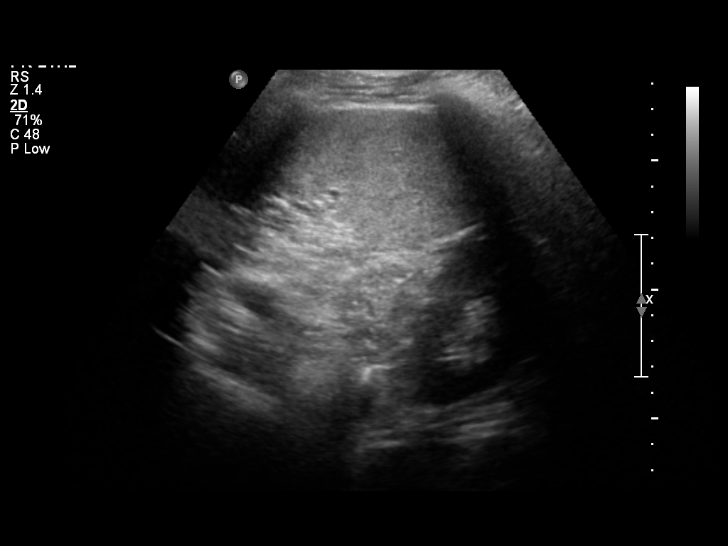
[im 21/28]
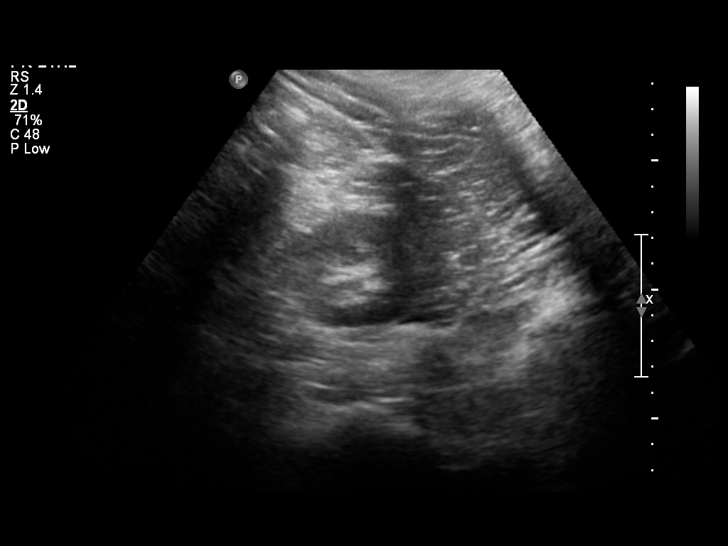
[im 23/28]
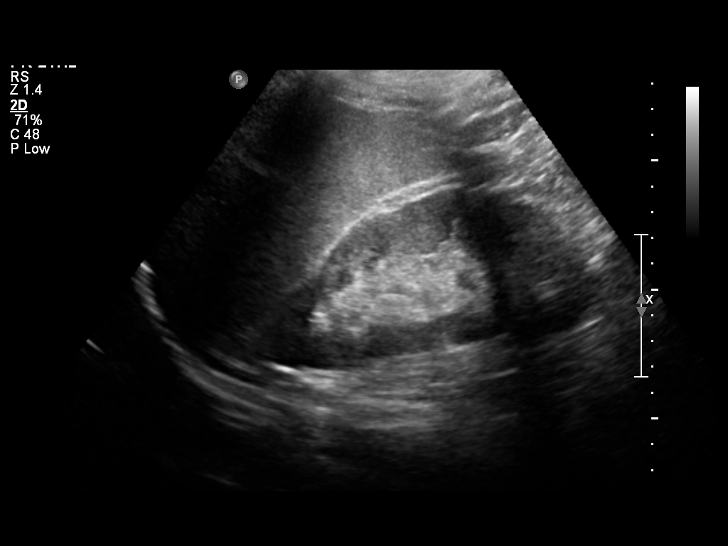
[im 25/28]
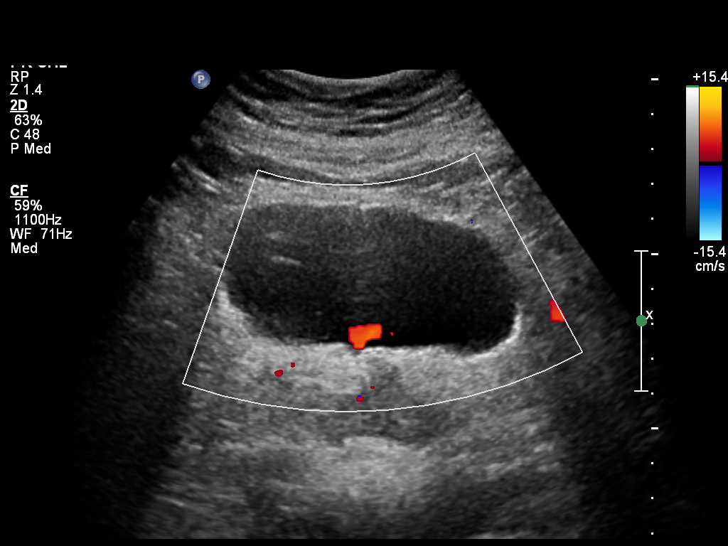
[im 28/28]
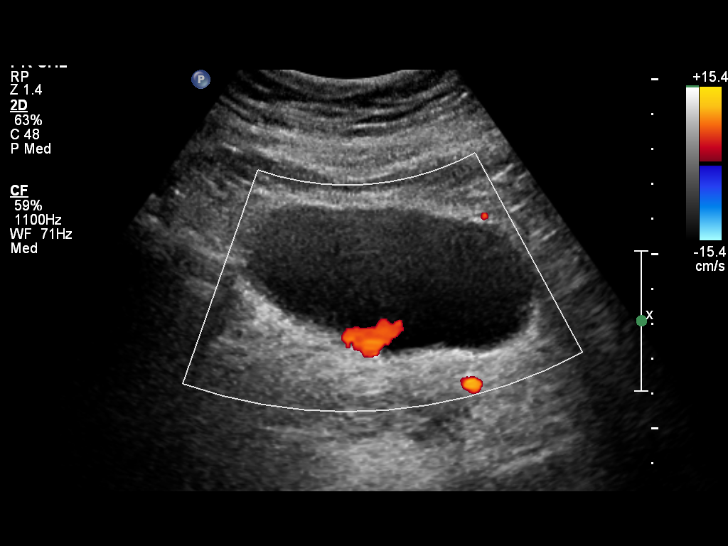

[14 of 25 positions shown; findings below may reference images not displayed]

FINDINGS: Right Kidney:  No hydronephrosis.  Well-preserved cortex.  Normal
size and parenchymal echogenicity, with the exception of a small
echogenic focus in the upper pole collecting system of the right
kidney, without definite distal acoustic shadowing.  13.4 cm in
length.

Left Kidney:  No hydronephrosis.  Well-preserved cortex.  Normal
size and parenchymal echotexture without focal abnormalities.
cm in length

Bladder:  Urinary bladder is well distended without focal wall
abnormalities.  Bilateral ureteral jets are noted.
IMPRESSION: 1.  Small echogenic focus in the upper pole collecting system of
the right kidney.  This is of uncertain etiology and significance.
This could represent a nonshadowing stone, but could alternatively
be a normal part of the collecting system.  If there is strong
clinical concern for nephrolithiasis, this could be better
evaluated with a noncontrast CT scan of the abdomen.
2.  Normal appearance of the left kidney and urinary bladder.

## 2014-09-21 ENCOUNTER — Encounter (HOSPITAL_COMMUNITY): Payer: Self-pay | Admitting: Obstetrics

## 2015-03-15 ENCOUNTER — Other Ambulatory Visit: Payer: Self-pay | Admitting: Obstetrics and Gynecology

## 2015-03-16 LAB — CYTOLOGY - PAP

## 2016-06-23 ENCOUNTER — Ambulatory Visit: Payer: Self-pay | Admitting: Podiatry

## 2016-10-30 NOTE — H&P (Addendum)
  Natalie Andrews S:  Natalie Andrews presents for LAVH, Bil Sal. She is continuing to have pelvic pain, aub and it's not responded to conservative management including OCAs and NSAIDs.  O:  Physical exam:  General:  Alert and oriented.  Normocephalic and atraumatic.  No thyromegaly or masses palpated.  Heart is regular rate and rhythm without murmur.  Lungs are clear to auscultation bilaterally.  Breasts without masses, lymphadenopathy or discharge.  Abdomen is soft, nontender, nondistended.  No rebound or guarding.  No flank pain is noted.  Extremities without clubbing, cyanosis or edema.  Normal external genitalia, Bartholin, Skene's and urethra.  Vagina without lesions.  Cervix within normal limits.  Uterus is anteverted, mobile and nontender.  No adnexal masses are palpable.  No inguinal lymphadenopathy palpable  O:  Ultrasound was carried out showing normal-appearing uterus other than suspicion for adenomyosis, no free fluid, no ovarian cyst seen.   A/P:  Pelvic pain, irregular bleeding.  She has a history of irregular bleeding for years and actually started birth control pills at [age].  She has no further childbearing desires.  Discussed a number of options for her:  1) Changing birth control pills or trying different hormonal options or even discontinuing birth control pills to see if this does not help.  Discussed the possibility of even some antibiotics as she is complaining of some increased discharge that is more mucousy in nature with consideration of treating her with doxycycline for nonspecific cervicitis and see if that makes any difference.  2) Hysteroscopy D&C.  3) Hysteroscopy D&C with ablation.  4) Hysteroscopy D&C and ablation with a tubal ligation and evaluation of pelvic pain with laparoscopy.  5) Hysterectomy with preservation of the ovaries. She wants to proceed with LAVH Bil Sal.  R&B discussed and informed consent obtained. DL  This patient has been seen and examined.   All of her questions were  answered.  Labs and vital signs reviewed.  Informed consent has been obtained.  The History and Physical is current. 11/06/16 0715 DL

## 2016-10-31 NOTE — Patient Instructions (Signed)
Your procedure is scheduled on:  Monday, Dec. 18, 2017  [facility] at:  6:00 AM  Pick up the phone at the desk and dial [phone].  Call this number if you have problems the morning of surgery: [phone].  Remember: Do NOT eat food or drink after:  Midnight Sunday  Take these medicines the morning of surgery with a SIP OF WATER:  None  Do not take Metformin the night before surgery  Stop ALL herbal medications at this time   Do NOT wear jewelry (body piercing), metal hair clips/bobby pins, make-up, or nail polish. Do NOT wear lotions, powders, or perfumes.  You may wear deodorant. Do NOT shave for 48 hours prior to surgery. Do NOT bring valuables to the hospital. Contacts, dentures, or bridgework may not be worn into surgery.  Leave suitcase in car.  After surgery it may be brought to your room.  For patients admitted to the hospital, checkout time is 11:00 AM the day of discharge.

## 2016-11-01 ENCOUNTER — Encounter (HOSPITAL_COMMUNITY): Payer: Self-pay

## 2016-11-01 ENCOUNTER — Encounter (HOSPITAL_COMMUNITY)
Admission: RE | Admit: 2016-11-01 | Discharge: 2016-11-01 | Disposition: A | Discharge status: Home / Self Care | Payer: BLUE CROSS/BLUE SHIELD | Source: Ambulatory Visit | Attending: Obstetrics and Gynecology | Admitting: Obstetrics and Gynecology

## 2016-11-01 DIAGNOSIS — R102 Pelvic and perineal pain: Secondary | ICD-10-CM | POA: Insufficient documentation

## 2016-11-01 DIAGNOSIS — N939 Abnormal uterine and vaginal bleeding, unspecified: Secondary | ICD-10-CM | POA: Insufficient documentation

## 2016-11-01 DIAGNOSIS — Z01812 Encounter for preprocedural laboratory examination: Secondary | ICD-10-CM | POA: Insufficient documentation

## 2016-11-01 HISTORY — DX: Polycystic ovarian syndrome: E28.2

## 2016-11-01 HISTORY — DX: Sleep apnea, unspecified: G47.30

## 2016-11-01 HISTORY — DX: Personal history of urinary calculi: Z87.442

## 2016-11-01 HISTORY — DX: Unspecified asthma, uncomplicated: J45.909

## 2016-11-01 HISTORY — DX: Gastro-esophageal reflux disease without esophagitis: K21.9

## 2016-11-01 LAB — COMPREHENSIVE METABOLIC PANEL
ALBUMIN: 4.3 g/dL (ref 3.5–5.0)
ALT: 37 U/L (ref 14–54)
ANION GAP: 9 (ref 5–15)
AST: 51 U/L — ABNORMAL HIGH (ref 15–41)
Alkaline Phosphatase: 67 U/L (ref 38–126)
BUN: 15 mg/dL (ref 6–20)
CO2: 19 mmol/L — AB (ref 22–32)
Calcium: 8.8 mg/dL — ABNORMAL LOW (ref 8.9–10.3)
Chloride: 104 mmol/L (ref 101–111)
Creatinine, Ser: 0.74 mg/dL (ref 0.44–1.00)
GFR calc non Af Amer: >60 mL/min (ref >60)
GLUCOSE: 129 mg/dL — AB (ref 65–99)
POTASSIUM: 4.1 mmol/L (ref 3.5–5.1)
SODIUM: 132 mmol/L — AB (ref 135–145)
TOTAL PROTEIN: 7.9 g/dL (ref 6.5–8.1)
Total Bilirubin: 0.3 mg/dL (ref 0.3–1.2)

## 2016-11-01 LAB — CBC
HCT: 41.2 % (ref 36.0–46.0)
Hemoglobin: 13.7 g/dL (ref 12.0–15.0)
MCH: 28.4 pg (ref 26.0–34.0)
MCHC: 33.3 g/dL (ref 30.0–36.0)
MCV: 85.3 fL (ref 78.0–100.0)
Platelets: 262 10*3/uL (ref 150–400)
RBC: 4.83 MIL/uL (ref 3.87–5.11)
RDW: 15.5 % (ref 11.5–15.5)
WBC: 8.6 10*3/uL (ref 4.0–10.5)

## 2016-11-02 LAB — TYPE AND SCREEN
ABO/RH(D): A POS
ANTIBODY SCREEN: NEGATIVE

## 2016-11-04 NOTE — Anesthesia Preprocedure Evaluation (Addendum)
Anesthesia Evaluation  Patient identified by MRN, date of birth, ID band Patient awake    Reviewed: Allergy & Precautions, NPO status , Patient's Chart, lab work & pertinent test results  History of Anesthesia Complications Negative for: history of anesthetic complications  Airway Mallampati: II  TM Distance: >3 FB Neck ROM: Full    Dental no notable dental hx. (+) Dental Advisory Given   Pulmonary asthma , sleep apnea , former smoker,    Pulmonary exam normal        Cardiovascular hypertension, Pt. on medications negative cardio ROS Normal cardiovascular exam     Neuro/Psych  Headaches, PSYCHIATRIC DISORDERS Anxiety Depression    GI/Hepatic Neg liver ROS, GERD  ,  Endo/Other  diabetesMorbid obesity  Renal/GU negative Renal ROS     Musculoskeletal negative musculoskeletal ROS (+)   Abdominal   Peds  Hematology negative hematology ROS (+)   Anesthesia Other Findings Day of surgery medications reviewed with the patient.  Reproductive/Obstetrics                            Anesthesia Physical Anesthesia Plan  ASA: III  Anesthesia Plan: General   Post-op Pain Management:    Induction: Intravenous  Airway Management Planned: Oral ETT  Additional Equipment:   Intra-op Plan:   Post-operative Plan: Extubation in OR  Informed Consent: I have reviewed the patients History and Physical, chart, labs and discussed the procedure including the risks, benefits and alternatives for the proposed anesthesia with the patient or authorized representative who has indicated his/her understanding and acceptance.   Dental advisory given  Plan Discussed with: CRNA and Anesthesiologist  Anesthesia Plan Comments:        Anesthesia Quick Evaluation

## 2016-11-05 MED ORDER — DEXTROSE 5 % IV SOLN
2.0000 g | INTRAVENOUS | Status: AC
  Administered 2016-11-06: 2 g via INTRAVENOUS
  Filled 2016-11-05: qty 2

## 2016-11-06 ENCOUNTER — Encounter (HOSPITAL_COMMUNITY): Payer: Self-pay | Admitting: Emergency Medicine

## 2016-11-06 ENCOUNTER — Observation Stay (HOSPITAL_COMMUNITY)
Admission: RE | Admit: 2016-11-06 | Discharge: 2016-11-07 | Disposition: A | Discharge status: Home / Self Care | Payer: BLUE CROSS/BLUE SHIELD | Source: Ambulatory Visit | Attending: Obstetrics and Gynecology | Admitting: Obstetrics and Gynecology

## 2016-11-06 ENCOUNTER — Encounter (HOSPITAL_COMMUNITY)
Admission: RE | Disposition: A | Discharge status: Home / Self Care | Payer: Self-pay | Source: Ambulatory Visit | Attending: Obstetrics and Gynecology

## 2016-11-06 ENCOUNTER — Ambulatory Visit (HOSPITAL_COMMUNITY): Payer: BLUE CROSS/BLUE SHIELD | Admitting: Anesthesiology

## 2016-11-06 DIAGNOSIS — Z87891 Personal history of nicotine dependence: Secondary | ICD-10-CM | POA: Diagnosis not present

## 2016-11-06 DIAGNOSIS — N736 Female pelvic peritoneal adhesions (postinfective): Secondary | ICD-10-CM | POA: Insufficient documentation

## 2016-11-06 DIAGNOSIS — Z7984 Long term (current) use of oral hypoglycemic drugs: Secondary | ICD-10-CM | POA: Insufficient documentation

## 2016-11-06 DIAGNOSIS — N888 Other specified noninflammatory disorders of cervix uteri: Secondary | ICD-10-CM | POA: Diagnosis present

## 2016-11-06 DIAGNOSIS — J45909 Unspecified asthma, uncomplicated: Secondary | ICD-10-CM | POA: Diagnosis not present

## 2016-11-06 DIAGNOSIS — Z6841 Body Mass Index (BMI) 40.0 and over, adult: Secondary | ICD-10-CM | POA: Diagnosis not present

## 2016-11-06 DIAGNOSIS — N838 Other noninflammatory disorders of ovary, fallopian tube and broad ligament: Secondary | ICD-10-CM | POA: Insufficient documentation

## 2016-11-06 DIAGNOSIS — K219 Gastro-esophageal reflux disease without esophagitis: Secondary | ICD-10-CM | POA: Insufficient documentation

## 2016-11-06 DIAGNOSIS — I10 Essential (primary) hypertension: Secondary | ICD-10-CM | POA: Diagnosis not present

## 2016-11-06 DIAGNOSIS — N926 Irregular menstruation, unspecified: Secondary | ICD-10-CM | POA: Diagnosis not present

## 2016-11-06 DIAGNOSIS — N800 Endometriosis of uterus: Secondary | ICD-10-CM | POA: Insufficient documentation

## 2016-11-06 DIAGNOSIS — E119 Type 2 diabetes mellitus without complications: Secondary | ICD-10-CM | POA: Diagnosis not present

## 2016-11-06 DIAGNOSIS — G4730 Sleep apnea, unspecified: Secondary | ICD-10-CM | POA: Diagnosis not present

## 2016-11-06 DIAGNOSIS — R102 Pelvic and perineal pain: Secondary | ICD-10-CM | POA: Insufficient documentation

## 2016-11-06 DIAGNOSIS — F418 Other specified anxiety disorders: Secondary | ICD-10-CM | POA: Diagnosis not present

## 2016-11-06 DIAGNOSIS — Z90710 Acquired absence of both cervix and uterus: Secondary | ICD-10-CM | POA: Diagnosis present

## 2016-11-06 HISTORY — DX: Family history of other specified conditions: Z84.89

## 2016-11-06 HISTORY — PX: LAPAROSCOPIC VAGINAL HYSTERECTOMY WITH SALPINGECTOMY: SHX6680

## 2016-11-06 LAB — HCG, SERUM, QUALITATIVE: Preg, Serum: NEGATIVE

## 2016-11-06 SURGERY — HYSTERECTOMY, VAGINAL, LAPAROSCOPY-ASSISTED, WITH SALPINGECTOMY
Anesthesia: General | Laterality: Bilateral

## 2016-11-06 MED ORDER — HYDROMORPHONE HCL 1 MG/ML IJ SOLN
0.2500 mg | INTRAMUSCULAR | Status: DC | PRN
  Administered 2016-11-06 (×2): 0.5 mg via INTRAVENOUS

## 2016-11-06 MED ORDER — ONDANSETRON HCL 4 MG/2ML IJ SOLN
INTRAMUSCULAR | Status: DC | PRN
  Administered 2016-11-06: 4 mg via INTRAVENOUS

## 2016-11-06 MED ORDER — HYDROMORPHONE HCL 1 MG/ML IJ SOLN
INTRAMUSCULAR | Status: AC
  Administered 2016-11-06: 0.5 mg via INTRAVENOUS
  Filled 2016-11-06: qty 1

## 2016-11-06 MED ORDER — FENTANYL CITRATE (PF) 250 MCG/5ML IJ SOLN
INTRAMUSCULAR | Status: AC
  Filled 2016-11-06: qty 5

## 2016-11-06 MED ORDER — DIPHENHYDRAMINE HCL 12.5 MG/5ML PO ELIX: 12.5000 mg | ORAL_SOLUTION | Freq: Four times a day (QID) | ORAL | Status: DC | PRN

## 2016-11-06 MED ORDER — ONDANSETRON HCL 4 MG/2ML IJ SOLN
INTRAMUSCULAR | Status: AC
  Filled 2016-11-06: qty 2

## 2016-11-06 MED ORDER — MIDAZOLAM HCL 5 MG/5ML IJ SOLN
INTRAMUSCULAR | Status: DC | PRN
  Administered 2016-11-06: 2 mg via INTRAVENOUS

## 2016-11-06 MED ORDER — SUGAMMADEX SODIUM 500 MG/5ML IV SOLN
INTRAVENOUS | Status: DC | PRN
  Administered 2016-11-06: 500 mg via INTRAVENOUS

## 2016-11-06 MED ORDER — MENTHOL 3 MG MT LOZG: 1.0000 | LOZENGE | OROMUCOSAL | Status: DC | PRN

## 2016-11-06 MED ORDER — PROMETHAZINE HCL 25 MG/ML IJ SOLN: 6.2500 mg | INTRAMUSCULAR | Status: DC | PRN

## 2016-11-06 MED ORDER — LIDOCAINE HCL (CARDIAC) 20 MG/ML IV SOLN
INTRAVENOUS | Status: DC | PRN
  Administered 2016-11-06: 60 mg via INTRAVENOUS

## 2016-11-06 MED ORDER — VENLAFAXINE HCL ER 150 MG PO CP24
150.0000 mg | ORAL_CAPSULE | Freq: Every day | ORAL | Status: DC
  Administered 2016-11-06: 150 mg via ORAL
  Filled 2016-11-06: qty 1

## 2016-11-06 MED ORDER — MIDAZOLAM HCL 2 MG/2ML IJ SOLN
INTRAMUSCULAR | Status: AC
  Filled 2016-11-06: qty 2

## 2016-11-06 MED ORDER — HYDROMORPHONE 1 MG/ML IV SOLN
INTRAVENOUS | Status: DC
  Administered 2016-11-06: 11:00:00 via INTRAVENOUS
  Administered 2016-11-06: 3.8 mg via INTRAVENOUS
  Administered 2016-11-06: 3.6 mL via INTRAVENOUS
  Administered 2016-11-07: 1.6 mg via INTRAVENOUS
  Administered 2016-11-07: 1.8 mg via INTRAVENOUS
  Filled 2016-11-06: qty 25

## 2016-11-06 MED ORDER — LACTATED RINGERS IV SOLN
INTRAVENOUS | Status: DC
  Administered 2016-11-06 (×2): via INTRAVENOUS

## 2016-11-06 MED ORDER — NALOXONE HCL 0.4 MG/ML IJ SOLN: 0.4000 mg | INTRAMUSCULAR | Status: DC | PRN

## 2016-11-06 MED ORDER — TOPIRAMATE 25 MG PO TABS
150.0000 mg | ORAL_TABLET | Freq: Every day | ORAL | Status: DC
  Administered 2016-11-06: 150 mg via ORAL
  Filled 2016-11-06: qty 2

## 2016-11-06 MED ORDER — HYDROMORPHONE HCL 1 MG/ML IJ SOLN: 0.2000 mg | INTRAMUSCULAR | Status: DC | PRN

## 2016-11-06 MED ORDER — IBUPROFEN 600 MG PO TABS: 600.0000 mg | ORAL_TABLET | Freq: Four times a day (QID) | ORAL | Status: DC | PRN

## 2016-11-06 MED ORDER — OXYCODONE-ACETAMINOPHEN 5-325 MG PO TABS: 1.0000 | ORAL_TABLET | ORAL | Status: DC | PRN

## 2016-11-06 MED ORDER — DEXAMETHASONE SODIUM PHOSPHATE 10 MG/ML IJ SOLN
INTRAMUSCULAR | Status: AC
  Filled 2016-11-06: qty 1

## 2016-11-06 MED ORDER — SCOPOLAMINE 1 MG/3DAYS TD PT72
1.0000 | MEDICATED_PATCH | Freq: Once | TRANSDERMAL | Status: DC
  Administered 2016-11-06: 1.5 mg via TRANSDERMAL

## 2016-11-06 MED ORDER — FENTANYL CITRATE (PF) 250 MCG/5ML IJ SOLN
INTRAMUSCULAR | Status: DC | PRN
  Administered 2016-11-06: 50 ug via INTRAVENOUS
  Administered 2016-11-06: 100 ug via INTRAVENOUS
  Administered 2016-11-06 (×2): 50 ug via INTRAVENOUS

## 2016-11-06 MED ORDER — DEXTROSE-NACL 5-0.45 % IV SOLN
INTRAVENOUS | Status: DC
  Administered 2016-11-06 – 2016-11-07 (×3): via INTRAVENOUS

## 2016-11-06 MED ORDER — ROCURONIUM BROMIDE 100 MG/10ML IV SOLN
INTRAVENOUS | Status: DC | PRN
  Administered 2016-11-06: 50 mg via INTRAVENOUS

## 2016-11-06 MED ORDER — PROPOFOL 10 MG/ML IV BOLUS
INTRAVENOUS | Status: DC | PRN
  Administered 2016-11-06: 150 mg via INTRAVENOUS

## 2016-11-06 MED ORDER — PROPOFOL 10 MG/ML IV BOLUS
INTRAVENOUS | Status: AC
  Filled 2016-11-06: qty 20

## 2016-11-06 MED ORDER — BUPIVACAINE HCL (PF) 0.25 % IJ SOLN
INTRAMUSCULAR | Status: AC
  Filled 2016-11-06: qty 30

## 2016-11-06 MED ORDER — ONDANSETRON HCL 4 MG/2ML IJ SOLN: 4.0000 mg | Freq: Four times a day (QID) | INTRAMUSCULAR | Status: DC | PRN

## 2016-11-06 MED ORDER — DIPHENHYDRAMINE HCL 50 MG/ML IJ SOLN: 12.5000 mg | Freq: Four times a day (QID) | INTRAMUSCULAR | Status: DC | PRN

## 2016-11-06 MED ORDER — SODIUM CHLORIDE 0.9% FLUSH: 9.0000 mL | INTRAVENOUS | Status: DC | PRN

## 2016-11-06 MED ORDER — LIDOCAINE HCL 1 % IJ SOLN
INTRAMUSCULAR | Status: AC
  Filled 2016-11-06: qty 20

## 2016-11-06 MED ORDER — SCOPOLAMINE 1 MG/3DAYS TD PT72
MEDICATED_PATCH | TRANSDERMAL | Status: AC
  Administered 2016-11-06: 1.5 mg via TRANSDERMAL
  Filled 2016-11-06: qty 1

## 2016-11-06 MED ORDER — BUPIVACAINE HCL (PF) 0.25 % IJ SOLN
INTRAMUSCULAR | Status: DC | PRN
  Administered 2016-11-06: 10 mL

## 2016-11-06 MED ORDER — LIDOCAINE HCL (CARDIAC) 20 MG/ML IV SOLN
INTRAVENOUS | Status: AC
  Filled 2016-11-06: qty 5

## 2016-11-06 SURGICAL SUPPLY — 53 items
BLADE SURG 15 STRL LF C SS BP (BLADE) ×1 IMPLANT
BLADE SURG 15 STRL SS (BLADE) ×1
CABLE HIGH FREQUENCY MONO STRZ (ELECTRODE) IMPLANT
CATH ROBINSON RED A/P 16FR (CATHETERS) ×2 IMPLANT
CLOTH BEACON ORANGE TIMEOUT ST (SAFETY) ×2 IMPLANT
CONT PATH 16OZ SNAP LID 3702 (MISCELLANEOUS) ×2 IMPLANT
COVER BACK TABLE 60X90IN (DRAPES) ×2 IMPLANT
DECANTER SPIKE VIAL GLASS SM (MISCELLANEOUS) ×2 IMPLANT
DERMABOND ADVANCED (GAUZE/BANDAGES/DRESSINGS) ×1
DERMABOND ADVANCED .7 DNX12 (GAUZE/BANDAGES/DRESSINGS) ×1 IMPLANT
DRSG COVADERM PLUS 2X2 (GAUZE/BANDAGES/DRESSINGS) ×2 IMPLANT
DRSG OPSITE POSTOP 3X4 (GAUZE/BANDAGES/DRESSINGS) ×2 IMPLANT
DURAPREP 26ML APPLICATOR (WOUND CARE) ×2 IMPLANT
ELECT LIGASURE LONG (ELECTRODE) ×2 IMPLANT
ELECT REM PT RETURN 9FT ADLT (ELECTROSURGICAL) ×2
ELECTRODE REM PT RTRN 9FT ADLT (ELECTROSURGICAL) ×1 IMPLANT
FORCEPS CUTTING 45CM 5MM (CUTTING FORCEPS) ×2 IMPLANT
GLOVE BIO SURGEON STRL SZ7 (GLOVE) ×4 IMPLANT
GLOVE BIO SURGEON STRL SZ8 (GLOVE) ×2 IMPLANT
GLOVE BIOGEL PI IND STRL 6.5 (GLOVE) ×1 IMPLANT
GLOVE BIOGEL PI IND STRL 7.0 (GLOVE) ×3 IMPLANT
GLOVE BIOGEL PI INDICATOR 6.5 (GLOVE) ×1
GLOVE BIOGEL PI INDICATOR 7.0 (GLOVE) ×3
GLOVE SURG ORTHO 8.0 STRL STRW (GLOVE) ×6 IMPLANT
LEGGING LITHOTOMY PAIR STRL (DRAPES) ×2 IMPLANT
LIGASURE LAP L-HOOKWIRE 5 44CM (INSTRUMENTS) IMPLANT
NEEDLE INSUFFLATION 120MM (ENDOMECHANICALS) ×2 IMPLANT
NS IRRIG 1000ML POUR BTL (IV SOLUTION) ×2 IMPLANT
PACK LAVH (CUSTOM PROCEDURE TRAY) ×2 IMPLANT
PACK ROBOTIC GOWN (GOWN DISPOSABLE) ×2 IMPLANT
PACK TRENDGUARD 450 HYBRID PRO (MISCELLANEOUS) IMPLANT
PACK TRENDGUARD 600 HYBRD PROC (MISCELLANEOUS) ×1 IMPLANT
PROTECTOR NERVE ULNAR (MISCELLANEOUS) ×2 IMPLANT
RETRACTOR TRAXI PANNICULUS (MISCELLANEOUS) ×1 IMPLANT
SET IRRIG TUBING LAPAROSCOPIC (IRRIGATION / IRRIGATOR) IMPLANT
SLEEVE XCEL OPT CAN 5 100 (ENDOMECHANICALS) IMPLANT
SOLUTION ELECTROLUBE (MISCELLANEOUS) IMPLANT
SUT MNCRL 0 MO-4 VIOLET 18 CR (SUTURE) ×2 IMPLANT
SUT MNCRL 0 VIOLET 6X18 (SUTURE) ×1 IMPLANT
SUT MNCRL AB 0 CT1 27 (SUTURE) IMPLANT
SUT MON AB 2-0 CT1 36 (SUTURE) IMPLANT
SUT MONOCRYL 0 6X18 (SUTURE) ×1
SUT MONOCRYL 0 MO 4 18  CR/8 (SUTURE) ×2
SUT VICRYL 0 UR6 27IN ABS (SUTURE) ×2 IMPLANT
SUT VICRYL RAPIDE 3 0 (SUTURE) ×4 IMPLANT
TOWEL OR 17X24 6PK STRL BLUE (TOWEL DISPOSABLE) ×4 IMPLANT
TRAXI PANNICULUS RETRACTOR (MISCELLANEOUS) ×1
TRAY FOLEY CATH SILVER 14FR (SET/KITS/TRAYS/PACK) ×2 IMPLANT
TRENDGUARD 450 HYBRID PRO PACK (MISCELLANEOUS)
TRENDGUARD 600 HYBRID PROC PK (MISCELLANEOUS) ×2
TROCAR OPTI TIP 5M 100M (ENDOMECHANICALS) ×2 IMPLANT
TROCAR XCEL DIL TIP R 11M (ENDOMECHANICALS) ×2 IMPLANT
WARMER LAPAROSCOPE (MISCELLANEOUS) ×2 IMPLANT

## 2016-11-06 NOTE — Anesthesia Procedure Notes (Signed)
Procedure Name: Intubation Date/Time: 11/06/2016 7:32 AM Performed by: Renford DillsMULLINS, Arbutus Nelligan L Pre-anesthesia Checklist: Patient identified, Emergency Drugs available, Suction available and Patient being monitored Patient Re-evaluated:Patient Re-evaluated prior to inductionOxygen Delivery Method: Circle system utilized Preoxygenation: Pre-oxygenation with 100% oxygen Intubation Type: IV induction Ventilation: Mask ventilation without difficulty Laryngoscope Size: Miller and 2 Grade View: Grade II Tube type: Oral Tube size: 7.0 mm Number of attempts: 1 Airway Equipment and Method: Stylet Placement Confirmation: ETT inserted through vocal cords under direct vision,  positive ETCO2 and breath sounds checked- equal and bilateral Secured at: 21 cm Tube secured with: Tape Dental Injury: Teeth and Oropharynx as per pre-operative assessment

## 2016-11-06 NOTE — Anesthesia Postprocedure Evaluation (Signed)
Anesthesia Post Note  Patient: Natalie Andrews  Procedure(s) Performed: Procedure(s) (LRB): LAPAROSCOPIC ASSISTED VAGINAL HYSTERECTOMY WITH SALPINGECTOMY (Bilateral)  Patient location during evaluation: PACU Anesthesia Type: General Level of consciousness: sedated Pain management: pain level controlled Vital Signs Assessment: post-procedure vital signs reviewed and stable Respiratory status: spontaneous breathing and respiratory function stable Cardiovascular status: stable Anesthetic complications: no     Last Vitals:  Vitals:   11/06/16 1011 11/06/16 1035  BP:  123/63  Pulse: 79 81  Resp: 19 (!) 24  Temp: 36.4 C 36.6 C    Last Pain:  Vitals:   11/06/16 1035  TempSrc:   PainSc: 8    Pain Goal: Patients Stated Pain Goal: 4 (11/06/16 0606)               Heather RobertsSINGER,Natalie Andrews

## 2016-11-06 NOTE — Anesthesia Postprocedure Evaluation (Signed)
Anesthesia Post Note  Patient: Natalie Andrews  Procedure(s) Performed: Procedure(s) (LRB): LAPAROSCOPIC ASSISTED VAGINAL HYSTERECTOMY WITH SALPINGECTOMY (Bilateral)  Patient location during evaluation: [facility] Anesthesia Type: General Level of consciousness: awake Pain management: pain level controlled Vital Signs Assessment: post-procedure vital signs reviewed and stable Respiratory status: spontaneous breathing and patient connected to nasal cannula oxygen Cardiovascular status: stable Postop Assessment: patient able to bend at knees, no signs of nausea or vomiting, adequate PO intake and no headache Anesthetic complications: no        Last Vitals:  Vitals:   11/06/16 1245 11/06/16 1330  BP:  123/70  Pulse: 78 78  Resp: (!) 24 (!) 24  Temp:  36.5 C    Last Pain:  Vitals:   11/06/16 1244  TempSrc:   PainSc: 5    Pain Goal: Patients Stated Pain Goal: 3 (11/06/16 1043)               Mikaili Flippin

## 2016-11-06 NOTE — Brief Op Note (Signed)
11/06/2016  8:48 AM  PATIENT:  Natalie Andrews  35 y.o. female  PRE-OPERATIVE DIAGNOSIS:  pelvic pain, irregular bleeding, adenomyosis  POST-OPERATIVE DIAGNOSIS:  pelvic pain, irregular bleeding, adenomyosis  PROCEDURE:  Procedure(s): LAPAROSCOPIC ASSISTED VAGINAL HYSTERECTOMY WITH SALPINGECTOMY (Bilateral)  SURGEON:  Surgeon(s) and Role:    * Candice Campavid Lemon Whitacre, MD - Primary  PHYSICIAN ASSISTANT:   ASSISTANTS: Morris   ANESTHESIA:   general  EBL:  Total I/O In: 1000 [I.V.:1000] Out: 250 [Urine:250]  BLOOD ADMINISTERED:none  DRAINS: Urinary Catheter (Foley)   LOCAL MEDICATIONS USED:  MARCAINE     SPECIMEN:  Source of Specimen:  Uterus and bilateral tubes  DISPOSITION OF SPECIMEN:  PATHOLOGY  COUNTS:  YES  TOURNIQUET:  * No tourniquets in log *  DICTATION: .Other Dictation: Dictation Number 1  PLAN OF CARE: Admit for overnight observation  PATIENT DISPOSITION:  PACU - hemodynamically stable.   Delay start of Pharmacological VTE agent (>24hrs) due to surgical blood loss or risk of bleeding: not applicable

## 2016-11-06 NOTE — Addendum Note (Signed)
Addendum  created 11/06/16 1110 by Lenox Ahravid A Katalia Choma, CRNA   Charge Capture section accepted

## 2016-11-06 NOTE — Transfer of Care (Signed)
Immediate Anesthesia Transfer of Care Note  Patient: Natalie Andrews  Procedure(s) Performed: Procedure(s): LAPAROSCOPIC ASSISTED VAGINAL HYSTERECTOMY WITH SALPINGECTOMY (Bilateral)  Patient Location: PACU  Anesthesia Type:General  Level of Consciousness: awake, alert  and oriented  Airway & Oxygen Therapy: Patient Spontanous Breathing and Patient connected to face mask oxygen  Post-op Assessment: Report given to RN, Post -op Vital signs reviewed and stable and Patient moving all extremities X 4  Post vital signs: Reviewed and stable  Last Vitals:  Vitals:   11/06/16 0606  BP: 139/85  Pulse: 77  Resp: 15  Temp: 36.6 C    Last Pain:  Vitals:   11/06/16 0606  TempSrc: Oral      Patients Stated Pain Goal: 4 (11/06/16 0606)  Complications: No apparent anesthesia complications

## 2016-11-06 NOTE — Addendum Note (Signed)
Addendum  created 11/06/16 1359 by Renford DillsJanet L Kayden Hutmacher, CRNA   Sign clinical note

## 2016-11-07 DIAGNOSIS — N888 Other specified noninflammatory disorders of cervix uteri: Secondary | ICD-10-CM | POA: Diagnosis not present

## 2016-11-07 LAB — CBC
HCT: 39.9 % (ref 36.0–46.0)
HEMOGLOBIN: 13.1 g/dL (ref 12.0–15.0)
MCH: 28.4 pg (ref 26.0–34.0)
MCHC: 32.8 g/dL (ref 30.0–36.0)
MCV: 86.4 fL (ref 78.0–100.0)
PLATELETS: 215 10*3/uL (ref 150–400)
RBC: 4.62 MIL/uL (ref 3.87–5.11)
RDW: 15.6 % — AB (ref 11.5–15.5)
WBC: 11 10*3/uL — ABNORMAL HIGH (ref 4.0–10.5)

## 2016-11-07 MED ORDER — OXYCODONE-ACETAMINOPHEN 5-325 MG PO TABS: 1.0000 | ORAL_TABLET | ORAL | 0 refills | Status: AC | PRN

## 2016-11-07 MED ORDER — IBUPROFEN 600 MG PO TABS: 600.0000 mg | ORAL_TABLET | Freq: Four times a day (QID) | ORAL | 0 refills | Status: AC | PRN

## 2016-11-07 NOTE — Discharge Summary (Signed)
Physician Discharge Summary  Patient ID: Natalie Andrews Blumenfeld MRN: [id number] DOB/AGE: Aug 02, 1981 35 y.o.  Admit date: 11/06/2016 Discharge date: 11/07/2016  Admission Diagnoses:Pelvic pain, AUB  Discharge Diagnoses: Same Active Problems:   S/P laparoscopic assisted vaginal hysterectomy (LAVH)   Discharged Condition: good  Hospital Course: Pt underwent an uncomplicated LAVH, Bil Sal.  Her post op care was unremarkable with quick return of bowel and bladder function.  POD1 was tol reg diet and on oral pain meds and d/c'd home  Consults: None  Significant Diagnostic Studies: labs: 13.1 post op Hgb  Treatments: surgery: LAVH bil Sal  Discharge Exam: Blood pressure 129/73, pulse 89, temperature 98.3 F (36.8 C), temperature source Oral, resp. rate 20, height 5\' 4"  (1.626 m), weight 288 lb (130.6 kg), last menstrual period 11/02/2016, SpO2 95 %, unknown if currently breastfeeding. General appearance: alert, cooperative, appears stated age and no distress GI: soft, non-tender; bowel sounds normal; no masses,  no organomegaly Incision/Wound:CD&I  Disposition: 01-Home or Self Care  Discharge Instructions    Call MD for:  difficulty breathing, headache or visual disturbances    Complete by:  As directed    Call MD for:  persistant nausea and vomiting    Complete by:  As directed    Call MD for:  redness, tenderness, or signs of infection (pain, swelling, redness, odor or green/yellow discharge around incision site)    Complete by:  As directed    Call MD for:  severe uncontrolled pain    Complete by:  As directed    Call MD for:  temperature >100.4    Complete by:  As directed    Diet general    Complete by:  As directed    Driving Restrictions    Complete by:  As directed    No driving for 2 weeks   Increase activity slowly    Complete by:  As directed    Lifting restrictions    Complete by:  As directed    No lifting anything greater than 10 pounds (if you have to ask, don't  lift it)   Sexual Activity Restrictions    Complete by:  As directed    Nothing in the vagina for 6 weeks     Allergies as of 11/07/2016      Reactions   Imitrex [sumatriptan] Other (See Comments)   Burning sensation in neck   Prednisone Hives      Medication List    STOP taking these medications   NORTREL 1/35 (28) tablet Generic drug:  norethindrone-ethinyl estradiol 1/35     TAKE these medications   ibuprofen 600 MG tablet Commonly known as:  ADVIL,MOTRIN Take 1 tablet (600 mg total) by mouth every 6 (six) hours as needed (mild pain).   lisinopril 10 MG tablet Commonly known as:  PRINIVIL,ZESTRIL Take 10 mg by mouth at bedtime.   metFORMIN 500 MG tablet Commonly known as:  GLUCOPHAGE Take 1,500 mg by mouth at bedtime.   oxyCODONE-acetaminophen 5-325 MG tablet Commonly known as:  PERCOCET/ROXICET Take 1-2 tablets by mouth every 4 (four) hours as needed for severe pain (moderate to severe pain (when tolerating fluids)).   pantoprazole 40 MG tablet Commonly known as:  PROTONIX Take 40 mg by mouth at bedtime.   prenatal multivitamin Tabs tablet Take 1 tablet by mouth at bedtime.   topiramate 50 MG tablet Commonly known as:  TOPAMAX Take 150 mg by mouth at bedtime.   venlafaxine XR 75 MG 24 hr capsule Commonly known  as:  EFFEXOR-XR Take 225 mg by mouth at bedtime.        Signed: Glinda Natzke C 11/07/2016, 8:37 AM

## 2016-11-07 NOTE — Op Note (Signed)
NAME:  Pricilla HolmDON, Pellot                 ACCOUNT NO.:  [id number]  MEDICAL RECORD NO.:  [id number]  LOCATION:                                 FACILITY:  PHYSICIAN:  Dineen KidDavid C. Rana SnareLowe, M.D.         DATE OF BIRTH:  DATE OF PROCEDURE:  11/06/2016 DATE OF DISCHARGE:                              OPERATIVE REPORT   PREOPERATIVE DIAGNOSES:  Pelvic pain, irregular bleeding, adenomyosis.  POSTOPERATIVE DIAGNOSES:  Pelvic pain, irregular bleeding, adenomyosis.  PROCEDURE:  Laparoscopic-assisted vaginal hysterectomy and bilateral salpingectomy.  SURGEON:  Dineen Kidavid C. Rana SnareLowe, M.D.  ASSISTANT:  Dr. Balinda QuailsMegan Morse.  ANESTHESIA:  General endotracheal.  INDICATIONS:  Ms. Luz BrazenHartman is a 35 year old, no further childbearing desires.  She has continued to have problems with pelvic pain and abnormal uterine bleeding.  It has not responded to conservative medical management including birth control pills and anti-inflammatory medications.  She desired definitive surgical intervention, requests hysterectomy.  Risks and benefits were discussed at length.  Informed consent was obtained.  FINDINGS:  At the time of surgery, normal-appearing liver; appendix is retrocecal; uterus, slightly enlarged; normal-appearing ovaries and fallopian tubes.  DESCRIPTION OF PROCEDURE:  After adequate analgesia, the patient was placed in the dorsal lithotomy position.  She was sterilely prepped and draped.  Bladder sterilely drained.  Graves speculum was placed.  A Hulka tenaculum was placed on the cervix.  A 1 cm infraumbilical skin incision was made.  A Veress needle was inserted.  The abdomen was insufflated with dullness to percussion.  An 11-mm trocar was inserted. The above findings were noted.  A 5-mm port was placed to the left of the midline 2 fingerbreadths above the pubic symphysis under direct visualization.  After careful and systematic evaluation of the abdomen and pelvis, Gyrus cutting forceps was used to ligate and  dissect across the mesosalpinx, utero-ovarian ligament and round ligament, inferior portion of the broad ligament.  This was done bilaterally with the right ovary and left ovary falling laterally.  Good hemostasis achieved.  Care was taken to avoid the underlying ureter and achieve good hemostasis. The bladder was then elevated, and a window was made at the uterovesical junction creating a small bladder flap.  The abdomen is then desufflated.  Legs were repositioned.  Weighted speculum placed in the vagina.  Posterior colpotomy was performed.  Cervix was then circumscribed with Bovie cautery.  LigaSure instrument was used to ligate across the uterosacral ligaments bilaterally, cardinal ligaments, and bladder pillars bilaterally.  The anterior vaginal mucosa was dissected off the cervix, and a Deaver retractor was placed underneath the bladder.  LigaSure instrument was then used to ligate across the uterine __________ bilaterally up to the inferior portion of the broad ligament.  The uterus and fallopian tubes were removed.  Uterosacral ligaments were identified.  Suture ligated with a figure-of-eight 0 Monocryl suture.  The posterior peritoneum was closed in a pursestring fashion using 0 Monocryl suture.  The vagina was then closed in a vertical fashion using figure-of-eight 0 Monocryl suture, plicating the uterosacral ligaments in the midline.  Good support.  Good hemostasis was achieved.  A Foley catheter was placed.  Return of  clear yellow urine.  Legs were repositioned.  The abdomen re-insufflated with examination of all the pedicles, revealed good hemostasis.  Peristalsis was noted of the ureters bilaterally.  No obvious injury to bowel or bladder were noted.  The abdomen was then desufflated.  Trocars were removed.  The infraumbilical skin incision was closed with 0 Vicryl interrupted suture in the fascia, 3-0 Vicryl Rapide subcuticular suture in the skin, and 5 mm site was closed  with 3-0 Vicryl Rapide subcuticular suture.  The incision was injected with 0.25% Marcaine. Total 10 mL was used.  The patient was stable and transferred to recovery room.  Sponges and instrument count were normal x3.  The patient received 2 g of cefotetan preoperatively.  ESTIMATED BLOOD LOSS:  75 mL.     Dineen Kidavid C. Rana SnareLowe, M.D.     DCL/MEDQ  D:  11/06/2016  T:  11/07/2016  Job:  [id number]

## 2016-11-07 NOTE — Progress Notes (Signed)
1 Day Post-Op Procedure(s) (LRB): LAPAROSCOPIC ASSISTED VAGINAL HYSTERECTOMY WITH SALPINGECTOMY (Bilateral)  Subjective: Patient reports tolerating PO, + flatus and no problems voiding.    Objective: I have reviewed patient's vital signs, intake and output, medications and labs.  General: alert, cooperative, appears stated age and no distress GI: soft, non-tender; bowel sounds normal; no masses,  no organomegaly Vaginal Bleeding: none  Assessment: s/p Procedure(s): LAPAROSCOPIC ASSISTED VAGINAL HYSTERECTOMY WITH SALPINGECTOMY (Bilateral): stable and progressing well  Plan: Advance diet Encourage ambulation Advance to PO medication Discontinue IV fluids Discharge home  LOS: 0 days    Franko Hilliker C 11/07/2016, 8:35 AM

## 2016-11-07 NOTE — Progress Notes (Signed)
Pt out in wheelchair teaching complete  

## 2016-11-08 ENCOUNTER — Encounter (HOSPITAL_COMMUNITY): Payer: Self-pay | Admitting: Obstetrics and Gynecology

## 2018-02-19 ENCOUNTER — Other Ambulatory Visit: Payer: Self-pay | Admitting: Obstetrics and Gynecology

## 2018-02-19 DIAGNOSIS — N644 Mastodynia: Secondary | ICD-10-CM

## 2018-02-22 ENCOUNTER — Other Ambulatory Visit: Payer: Self-pay | Admitting: Obstetrics and Gynecology

## 2018-02-22 ENCOUNTER — Ambulatory Visit
Admission: RE | Admit: 2018-02-22 | Discharge: 2018-02-22 | Disposition: A | Discharge status: Home / Self Care | Payer: BLUE CROSS/BLUE SHIELD | Source: Ambulatory Visit | Attending: Obstetrics and Gynecology | Admitting: Obstetrics and Gynecology

## 2018-02-22 DIAGNOSIS — N644 Mastodynia: Secondary | ICD-10-CM

## 2019-09-03 ENCOUNTER — Other Ambulatory Visit: Payer: Self-pay

## 2019-09-03 DIAGNOSIS — Z20822 Contact with and (suspected) exposure to covid-19: Secondary | ICD-10-CM

## 2019-09-04 LAB — NOVEL CORONAVIRUS, NAA: SARS-CoV-2, NAA: NOT DETECTED

## 2020-01-02 ENCOUNTER — Ambulatory Visit
Admission: RE | Admit: 2020-01-02 | Discharge: 2020-01-02 | Disposition: A | Discharge status: Home / Self Care | Payer: 59 | Source: Ambulatory Visit | Attending: Physician Assistant | Admitting: Physician Assistant

## 2020-01-02 ENCOUNTER — Other Ambulatory Visit: Payer: Self-pay | Admitting: Physician Assistant

## 2020-01-02 DIAGNOSIS — R1011 Right upper quadrant pain: Secondary | ICD-10-CM

## 2020-01-02 DIAGNOSIS — R110 Nausea: Secondary | ICD-10-CM

## 2020-01-02 MED ORDER — IOPAMIDOL (ISOVUE-300) INJECTION 61%
125.0000 mL | Freq: Once | INTRAVENOUS | Status: AC | PRN
  Administered 2020-01-02: 15:00:00 125 mL via INTRAVENOUS

## 2023-06-25 ENCOUNTER — Other Ambulatory Visit (HOSPITAL_BASED_OUTPATIENT_CLINIC_OR_DEPARTMENT_OTHER): Payer: Self-pay

## 2023-06-25 MED ORDER — MOUNJARO 12.5 MG/0.5ML ~~LOC~~ SOAJ
12.5000 mg | SUBCUTANEOUS | 2 refills | Status: AC
  Filled 2023-06-25: qty 2, 28d supply, fill #0
  Filled 2023-07-18: qty 2, 28d supply, fill #1

## 2023-07-18 ENCOUNTER — Other Ambulatory Visit (HOSPITAL_BASED_OUTPATIENT_CLINIC_OR_DEPARTMENT_OTHER): Payer: Self-pay

## 2024-01-21 ENCOUNTER — Other Ambulatory Visit (HOSPITAL_BASED_OUTPATIENT_CLINIC_OR_DEPARTMENT_OTHER): Payer: Self-pay

## 2024-01-21 MED ORDER — AMPHETAMINE-DEXTROAMPHET ER 15 MG PO CP24
15.0000 mg | ORAL_CAPSULE | ORAL | 0 refills | Status: DC
  Filled 2024-01-21: qty 30, 30d supply, fill #0
